# Patient Record
Sex: Male | Born: 1950 | Race: Black or African American | Hispanic: No | Smoking: Never smoker
Health system: Southern US, Community
[De-identification: ages and names within clinical notes are randomized; demographics above are authoritative.]

## PROBLEM LIST (undated history)

## (undated) DIAGNOSIS — H409 Unspecified glaucoma: Secondary | ICD-10-CM

## (undated) DIAGNOSIS — F259 Schizoaffective disorder, unspecified: Secondary | ICD-10-CM

## (undated) DIAGNOSIS — B182 Chronic viral hepatitis C: Secondary | ICD-10-CM

## (undated) DIAGNOSIS — G8929 Other chronic pain: Secondary | ICD-10-CM

## (undated) DIAGNOSIS — M549 Dorsalgia, unspecified: Secondary | ICD-10-CM

## (undated) DIAGNOSIS — F3130 Bipolar disorder, current episode depressed, mild or moderate severity, unspecified: Secondary | ICD-10-CM

## (undated) DIAGNOSIS — I10 Essential (primary) hypertension: Secondary | ICD-10-CM

## (undated) HISTORY — PX: HX NO SURGICAL PROCEDURES: 2100001501

---
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
## 2014-11-24 ENCOUNTER — Encounter (HOSPITAL_COMMUNITY): Payer: Self-pay | Admitting: Emergency Medicine

## 2014-11-24 ENCOUNTER — Emergency Department (INDEPENDENT_AMBULATORY_CARE_PROVIDER_SITE_OTHER)
Admission: EM | Admit: 2014-11-24 | Discharge: 2014-11-24 | Disposition: A | Discharge status: Home / Self Care | Payer: Medicare Other | Source: Home / Self Care | Attending: Family Medicine | Admitting: Family Medicine

## 2014-11-24 DIAGNOSIS — I10 Essential (primary) hypertension: Secondary | ICD-10-CM

## 2014-11-24 MED ORDER — AMLODIPINE BESYLATE 10 MG PO TABS: 10.0000 mg | ORAL_TABLET | Freq: Every day | ORAL | Status: DC

## 2014-11-24 NOTE — ED Provider Notes (Signed)
CSN: [id number]     Arrival date & time 11/24/14  1626 History   First MD Initiated Contact with Patient 11/24/14 1707     No chief complaint on file.  (Consider location/radiation/quality/duration/timing/severity/associated sxs/prior Treatment) HPI Comments: Patient presents today for a refill on Norvasc. He reports that his MD in [place] prescribed this, but he has no further refills and has not taken in a month. He generally gets local pharmacy. He reports no headaches, dizziness, chest pain, dyspnea. No edema. He has an appt with the [facility] on June 23rd.   The history is provided by the patient.    No past medical history on file. No past surgical history on file. No family history on file. History  Substance Use Topics  . Smoking status: Not on file  . Smokeless tobacco: Not on file  . Alcohol Use: Not on file    Review of Systems  All other systems reviewed and are negative.   Allergies  Review of patient's allergies indicates not on file.  Home Medications   Prior to Admission medications   Medication Sig Start Date End Date Taking? Authorizing Provider  amLODipine (NORVASC) 10 MG tablet Take 1 tablet (10 mg total) by mouth daily. 11/24/14   Riki Sheer, PA-C   BP 177/110 mmHg  Pulse 87  Temp(Src) 99.2 F (37.3 C) (Oral)  Resp 20  SpO2 100% Physical Exam  Constitutional: He is oriented to person, place, and time. He appears well-developed and well-nourished. No distress.  HENT:  Head: Normocephalic and atraumatic.  Cardiovascular: Normal rate and regular rhythm.   No murmur heard. Pulmonary/Chest: Effort normal and breath sounds normal. No respiratory distress.  Neurological: He is alert and oriented to person, place, and time.  Skin: Skin is warm and dry. He is not diaphoretic.  Psychiatric: His behavior is normal.  Nursing note and vitals reviewed.   ED Course  Procedures (including critical care time) Labs Review Labs Reviewed - No data  to display  Imaging Review No results found.   MDM   1. Essential hypertension   I called [facility] on [address] to verify he as getting the Norvasc and he was. He is asymptomatic. Ok to refill this time only. Will need to keep his f/u with PCP to establish care. Should he begin to have alarming symptoms, please go the ER    Riki Sheer, PA-C 11/24/14 1753

## 2014-11-24 NOTE — ED Notes (Signed)
Pt needs a refill on blood pressure medication

## 2014-11-24 NOTE — Discharge Instructions (Signed)
Hypertension °Hypertension, commonly called high blood pressure, is when the force of blood pumping through your arteries is too strong. Your arteries are the blood vessels that carry blood from your heart throughout your body. A blood pressure reading consists of a higher number over a lower number, such as 110/72. The higher number (systolic) is the pressure inside your arteries when your heart pumps. The lower number (diastolic) is the pressure inside your arteries when your heart relaxes. Ideally you want your blood pressure below 120/80. °Hypertension forces your heart to work harder to pump blood. Your arteries may become narrow or stiff. Having hypertension puts you at risk for heart disease, stroke, and other problems.  °RISK FACTORS °Some risk factors for high blood pressure are controllable. Others are not.  °Risk factors you cannot control include:  °· Race. You may be at higher risk if you are African American. °· Age. Risk increases with age. °· Gender. Men are at higher risk than women before age 45 years. After age 65, women are at higher risk than men. °Risk factors you can control include: °· Not getting enough exercise or physical activity. °· Being overweight. °· Getting too much fat, sugar, calories, or salt in your diet. °· Drinking too much alcohol. °SIGNS AND SYMPTOMS °Hypertension does not usually cause signs or symptoms. Extremely high blood pressure (hypertensive crisis) may cause headache, anxiety, shortness of breath, and nosebleed. °DIAGNOSIS  °To check if you have hypertension, your health care provider will measure your blood pressure while you are seated, with your arm held at the level of your heart. It should be measured at least twice using the same arm. Certain conditions can cause a difference in blood pressure between your right and left arms. A blood pressure reading that is higher than normal on one occasion does not mean that you need treatment. If one blood pressure reading  is high, ask your health care provider about having it checked again. °TREATMENT  °Treating high blood pressure includes making lifestyle changes and possibly taking medicine. Living a healthy lifestyle can help lower high blood pressure. You may need to change some of your habits. °Lifestyle changes may include: °· Following the DASH diet. This diet is high in fruits, vegetables, and whole grains. It is low in salt, red meat, and added sugars. °· Getting at least 2½ hours of brisk physical activity every week. °· Losing weight if necessary. °· Not smoking. °· Limiting alcoholic beverages. °· Learning ways to reduce stress. ° If lifestyle changes are not enough to get your blood pressure under control, your health care provider may prescribe medicine. You may need to take more than one. Work closely with your health care provider to understand the risks and benefits. °HOME CARE INSTRUCTIONS °· Have your blood pressure rechecked as directed by your health care provider.   °· Take medicines only as directed by your health care provider. Follow the directions carefully. Blood pressure medicines must be taken as prescribed. The medicine does not work as well when you skip doses. Skipping doses also puts you at risk for problems.   °· Do not smoke.   °· Monitor your blood pressure at home as directed by your health care provider.  °SEEK MEDICAL CARE IF:  °· You think you are having a reaction to medicines taken. °· You have recurrent headaches or feel dizzy. °· You have swelling in your ankles. °· You have trouble with your vision. °SEEK IMMEDIATE MEDICAL CARE IF: °· You develop a severe headache or confusion. °·   You have unusual weakness, numbness, or feel faint.  You have severe chest or abdominal pain.  You vomit repeatedly.  You have trouble breathing. MAKE SURE YOU:   Understand these instructions.  Will watch your condition.  Will get help right away if you are not doing well or get worse. Document  Released: 05/27/2005 Document Revised: 10/11/2013 Document Reviewed: 03/19/2013 [facility] Patient Information 2015 [place], [place]. This information is not intended to replace advice given to you by your health care provider. Make sure you discuss any questions you have with your health care provider.    Ok to resume your Norvasc; however if any side effects or you begin having unusual symptoms; please go the ED for further evaluation.

## 2015-01-11 ENCOUNTER — Encounter (HOSPITAL_COMMUNITY): Payer: Self-pay | Admitting: Emergency Medicine

## 2015-01-11 ENCOUNTER — Emergency Department (HOSPITAL_COMMUNITY)
Admission: EM | Admit: 2015-01-11 | Discharge: 2015-01-11 | Disposition: A | Discharge status: Home / Self Care | Payer: Medicare Other | Attending: Emergency Medicine | Admitting: Emergency Medicine

## 2015-01-11 ENCOUNTER — Inpatient Hospital Stay (HOSPITAL_COMMUNITY)
Admission: AD | Admit: 2015-01-11 | Discharge: 2015-01-18 | DRG: 885 | Disposition: A | Discharge status: Home / Self Care | Payer: Medicare Other | Source: Intra-hospital | Attending: Psychiatry | Admitting: Psychiatry

## 2015-01-11 ENCOUNTER — Encounter (HOSPITAL_COMMUNITY): Payer: Self-pay | Admitting: Cardiology

## 2015-01-11 ENCOUNTER — Emergency Department (HOSPITAL_COMMUNITY): Payer: Medicare Other

## 2015-01-11 ENCOUNTER — Encounter (HOSPITAL_COMMUNITY): Payer: Self-pay

## 2015-01-11 DIAGNOSIS — B182 Chronic viral hepatitis C: Secondary | ICD-10-CM | POA: Insufficient documentation

## 2015-01-11 DIAGNOSIS — I10 Essential (primary) hypertension: Secondary | ICD-10-CM | POA: Diagnosis present

## 2015-01-11 DIAGNOSIS — Z8669 Personal history of other diseases of the nervous system and sense organs: Secondary | ICD-10-CM | POA: Insufficient documentation

## 2015-01-11 DIAGNOSIS — F10230 Alcohol dependence with withdrawal, uncomplicated: Secondary | ICD-10-CM | POA: Diagnosis not present

## 2015-01-11 DIAGNOSIS — F1420 Cocaine dependence, uncomplicated: Secondary | ICD-10-CM | POA: Diagnosis present

## 2015-01-11 DIAGNOSIS — F1020 Alcohol dependence, uncomplicated: Secondary | ICD-10-CM | POA: Diagnosis present

## 2015-01-11 DIAGNOSIS — R45851 Suicidal ideations: Secondary | ICD-10-CM | POA: Diagnosis present

## 2015-01-11 DIAGNOSIS — Z880 Allergy status to penicillin: Secondary | ICD-10-CM | POA: Diagnosis not present

## 2015-01-11 DIAGNOSIS — Z79899 Other long term (current) drug therapy: Secondary | ICD-10-CM | POA: Diagnosis not present

## 2015-01-11 DIAGNOSIS — F1410 Cocaine abuse, uncomplicated: Secondary | ICD-10-CM | POA: Diagnosis not present

## 2015-01-11 DIAGNOSIS — F320 Major depressive disorder, single episode, mild: Secondary | ICD-10-CM | POA: Diagnosis not present

## 2015-01-11 DIAGNOSIS — R4689 Other symptoms and signs involving appearance and behavior: Secondary | ICD-10-CM

## 2015-01-11 DIAGNOSIS — Z8619 Personal history of other infectious and parasitic diseases: Secondary | ICD-10-CM | POA: Diagnosis not present

## 2015-01-11 DIAGNOSIS — F1910 Other psychoactive substance abuse, uncomplicated: Secondary | ICD-10-CM | POA: Diagnosis not present

## 2015-01-11 DIAGNOSIS — F332 Major depressive disorder, recurrent severe without psychotic features: Principal | ICD-10-CM | POA: Diagnosis present

## 2015-01-11 DIAGNOSIS — F1994 Other psychoactive substance use, unspecified with psychoactive substance-induced mood disorder: Secondary | ICD-10-CM | POA: Diagnosis present

## 2015-01-11 DIAGNOSIS — F329 Major depressive disorder, single episode, unspecified: Secondary | ICD-10-CM | POA: Insufficient documentation

## 2015-01-11 DIAGNOSIS — Z915 Personal history of self-harm: Secondary | ICD-10-CM

## 2015-01-11 DIAGNOSIS — R079 Chest pain, unspecified: Secondary | ICD-10-CM | POA: Diagnosis present

## 2015-01-11 DIAGNOSIS — R4589 Other symptoms and signs involving emotional state: Secondary | ICD-10-CM

## 2015-01-11 HISTORY — DX: Unspecified glaucoma: H40.9

## 2015-01-11 HISTORY — DX: Essential (primary) hypertension: I10

## 2015-01-11 HISTORY — DX: Chronic viral hepatitis C: B18.2

## 2015-01-11 LAB — CBC WITH DIFFERENTIAL/PLATELET
Basophils Absolute: 0 10*3/uL (ref 0.0–0.1)
Basophils Relative: 0 % (ref 0–1)
EOS ABS: 0 10*3/uL (ref 0.0–0.7)
Eosinophils Relative: 0 % (ref 0–5)
HCT: 39.3 % (ref 39.0–52.0)
Hemoglobin: 13.4 g/dL (ref 13.0–17.0)
LYMPHS PCT: 16 % (ref 12–46)
Lymphs Abs: 1.4 10*3/uL (ref 0.7–4.0)
MCH: 32.6 pg (ref 26.0–34.0)
MCHC: 34.1 g/dL (ref 30.0–36.0)
MCV: 95.6 fL (ref 78.0–100.0)
MONO ABS: 0.7 10*3/uL (ref 0.1–1.0)
Monocytes Relative: 8 % (ref 3–12)
Neutro Abs: 6.7 10*3/uL (ref 1.7–7.7)
Neutrophils Relative %: 76 % (ref 43–77)
PLATELETS: 196 10*3/uL (ref 150–400)
RBC: 4.11 MIL/uL — ABNORMAL LOW (ref 4.22–5.81)
RDW: 13.5 % (ref 11.5–15.5)
WBC: 8.8 10*3/uL (ref 4.0–10.5)

## 2015-01-11 LAB — BASIC METABOLIC PANEL
Anion gap: 12 (ref 5–15)
BUN: 14 mg/dL (ref 6–20)
CHLORIDE: 104 mmol/L (ref 101–111)
CO2: 20 mmol/L — AB (ref 22–32)
CREATININE: 1.41 mg/dL — AB (ref 0.61–1.24)
Calcium: 9.3 mg/dL (ref 8.9–10.3)
GFR calc Af Amer: 58 mL/min — ABNORMAL LOW (ref >60)
GFR calc non Af Amer: 50 mL/min — ABNORMAL LOW (ref >60)
GLUCOSE: 88 mg/dL (ref 65–99)
Potassium: 3.6 mmol/L (ref 3.5–5.1)
Sodium: 136 mmol/L (ref 135–145)

## 2015-01-11 LAB — COMPREHENSIVE METABOLIC PANEL
ALT: 34 U/L (ref 17–63)
ANION GAP: 10 (ref 5–15)
AST: 42 U/L — AB (ref 15–41)
Albumin: 4.2 g/dL (ref 3.5–5.0)
Alkaline Phosphatase: 63 U/L (ref 38–126)
BUN: 11 mg/dL (ref 6–20)
CO2: 25 mmol/L (ref 22–32)
CREATININE: 0.99 mg/dL (ref 0.61–1.24)
Calcium: 9.8 mg/dL (ref 8.9–10.3)
Chloride: 103 mmol/L (ref 101–111)
GFR calc non Af Amer: >60 mL/min (ref >60)
GLUCOSE: 79 mg/dL (ref 65–99)
POTASSIUM: 3.5 mmol/L (ref 3.5–5.1)
Sodium: 138 mmol/L (ref 135–145)
Total Bilirubin: 1.7 mg/dL — ABNORMAL HIGH (ref 0.3–1.2)
Total Protein: 8.5 g/dL — ABNORMAL HIGH (ref 6.5–8.1)

## 2015-01-11 LAB — ACETAMINOPHEN LEVEL

## 2015-01-11 LAB — RAPID URINE DRUG SCREEN, HOSP PERFORMED
AMPHETAMINES: NOT DETECTED
AMPHETAMINES: NOT DETECTED
BENZODIAZEPINES: NOT DETECTED
Barbiturates: NOT DETECTED
Barbiturates: NOT DETECTED
Benzodiazepines: NOT DETECTED
Cocaine: POSITIVE — AB
Cocaine: POSITIVE — AB
OPIATES: NOT DETECTED
Opiates: NOT DETECTED
Tetrahydrocannabinol: NOT DETECTED
Tetrahydrocannabinol: NOT DETECTED

## 2015-01-11 LAB — I-STAT TROPONIN, ED
TROPONIN I, POC: 0.02 ng/mL (ref 0.00–0.08)
Troponin i, poc: 0.02 ng/mL (ref 0.00–0.08)

## 2015-01-11 LAB — CBC
HCT: 43.1 % (ref 39.0–52.0)
HEMOGLOBIN: 14.6 g/dL (ref 13.0–17.0)
MCH: 32.6 pg (ref 26.0–34.0)
MCHC: 33.9 g/dL (ref 30.0–36.0)
MCV: 96.2 fL (ref 78.0–100.0)
Platelets: 207 10*3/uL (ref 150–400)
RBC: 4.48 MIL/uL (ref 4.22–5.81)
RDW: 13.6 % (ref 11.5–15.5)
WBC: 10.2 10*3/uL (ref 4.0–10.5)

## 2015-01-11 LAB — ETHANOL
Alcohol, Ethyl (B): 8 mg/dL — ABNORMAL HIGH (ref <5)
Alcohol, Ethyl (B): <5 mg/dL (ref <5)

## 2015-01-11 LAB — SALICYLATE LEVEL: Salicylate Lvl: <4 mg/dL (ref 2.8–30.0)

## 2015-01-11 MED ORDER — SODIUM CHLORIDE 0.9 % IV BOLUS (SEPSIS)
1000.0000 mL | Freq: Once | INTRAVENOUS | Status: AC
  Administered 2015-01-11: 1000 mL via INTRAVENOUS

## 2015-01-11 MED ORDER — LOPERAMIDE HCL 2 MG PO CAPS: 2.0000 mg | ORAL_CAPSULE | ORAL | Status: AC | PRN

## 2015-01-11 MED ORDER — VITAMIN B-1 100 MG PO TABS
100.0000 mg | ORAL_TABLET | Freq: Every day | ORAL | Status: DC
  Administered 2015-01-12 – 2015-01-18 (×7): 100 mg via ORAL
  Filled 2015-01-11 (×9): qty 1

## 2015-01-11 MED ORDER — ALUM & MAG HYDROXIDE-SIMETH 200-200-20 MG/5ML PO SUSP: 30.0000 mL | ORAL | Status: DC | PRN

## 2015-01-11 MED ORDER — AMLODIPINE BESYLATE 5 MG PO TABS: 10.0000 mg | ORAL_TABLET | Freq: Once | ORAL | Status: DC

## 2015-01-11 MED ORDER — IBUPROFEN 400 MG PO TABS: 600.0000 mg | ORAL_TABLET | Freq: Three times a day (TID) | ORAL | Status: DC | PRN

## 2015-01-11 MED ORDER — NITROGLYCERIN 2 % TD OINT
1.0000 [in_us] | TOPICAL_OINTMENT | Freq: Once | TRANSDERMAL | Status: AC
  Administered 2015-01-11: 1 [in_us] via TOPICAL
  Filled 2015-01-11: qty 1

## 2015-01-11 MED ORDER — LORAZEPAM 2 MG/ML IJ SOLN: 0.0000 mg | Freq: Four times a day (QID) | INTRAMUSCULAR | Status: DC

## 2015-01-11 MED ORDER — CHLORDIAZEPOXIDE HCL 25 MG PO CAPS: 25.0000 mg | ORAL_CAPSULE | Freq: Four times a day (QID) | ORAL | Status: AC | PRN

## 2015-01-11 MED ORDER — MAGNESIUM HYDROXIDE 400 MG/5ML PO SUSP: 30.0000 mL | Freq: Every day | ORAL | Status: DC | PRN

## 2015-01-11 MED ORDER — NICOTINE 21 MG/24HR TD PT24: 21.0000 mg | MEDICATED_PATCH | Freq: Every day | TRANSDERMAL | Status: DC

## 2015-01-11 MED ORDER — LORAZEPAM 1 MG PO TABS: 1.0000 mg | ORAL_TABLET | Freq: Three times a day (TID) | ORAL | Status: DC | PRN

## 2015-01-11 MED ORDER — ZOLPIDEM TARTRATE 5 MG PO TABS: 5.0000 mg | ORAL_TABLET | Freq: Every evening | ORAL | Status: DC | PRN

## 2015-01-11 MED ORDER — ONDANSETRON HCL 4 MG PO TABS: 4.0000 mg | ORAL_TABLET | Freq: Three times a day (TID) | ORAL | Status: DC | PRN

## 2015-01-11 MED ORDER — ACETAMINOPHEN 325 MG PO TABS: 650.0000 mg | ORAL_TABLET | ORAL | Status: DC | PRN

## 2015-01-11 MED ORDER — VITAMIN B-1 100 MG PO TABS
100.0000 mg | ORAL_TABLET | Freq: Every day | ORAL | Status: DC
  Administered 2015-01-11: 100 mg via ORAL
  Filled 2015-01-11: qty 1

## 2015-01-11 MED ORDER — HYDROXYZINE HCL 25 MG PO TABS: 25.0000 mg | ORAL_TABLET | Freq: Four times a day (QID) | ORAL | Status: AC | PRN

## 2015-01-11 MED ORDER — LORAZEPAM 1 MG PO TABS: 0.0000 mg | ORAL_TABLET | Freq: Four times a day (QID) | ORAL | Status: DC

## 2015-01-11 MED ORDER — ACETAMINOPHEN 325 MG PO TABS
650.0000 mg | ORAL_TABLET | Freq: Four times a day (QID) | ORAL | Status: DC | PRN
  Administered 2015-01-16 – 2015-01-18 (×3): 650 mg via ORAL
  Filled 2015-01-11 (×3): qty 2

## 2015-01-11 MED ORDER — ADULT MULTIVITAMIN W/MINERALS CH
1.0000 | ORAL_TABLET | Freq: Every day | ORAL | Status: DC
  Administered 2015-01-12 – 2015-01-18 (×7): 1 via ORAL
  Filled 2015-01-11 (×9): qty 1

## 2015-01-11 MED ORDER — AMLODIPINE BESYLATE 5 MG PO TABS
10.0000 mg | ORAL_TABLET | Freq: Every day | ORAL | Status: DC
  Administered 2015-01-11: 10 mg via ORAL
  Filled 2015-01-11: qty 2

## 2015-01-11 MED ORDER — LORAZEPAM 1 MG PO TABS: 0.0000 mg | ORAL_TABLET | Freq: Two times a day (BID) | ORAL | Status: DC

## 2015-01-11 MED ORDER — LORAZEPAM 2 MG/ML IJ SOLN
0.0000 mg | Freq: Two times a day (BID) | INTRAMUSCULAR | Status: DC
Start: 2015-01-13 — End: 2015-01-11

## 2015-01-11 MED ORDER — AMLODIPINE BESYLATE 10 MG PO TABS
10.0000 mg | ORAL_TABLET | Freq: Every day | ORAL | Status: DC
  Administered 2015-01-12 – 2015-01-18 (×7): 10 mg via ORAL
  Filled 2015-01-11 (×10): qty 1

## 2015-01-11 MED ORDER — TRAZODONE HCL 50 MG PO TABS
50.0000 mg | ORAL_TABLET | Freq: Every evening | ORAL | Status: DC | PRN
  Filled 2015-01-11: qty 1

## 2015-01-11 MED ORDER — THIAMINE HCL 100 MG/ML IJ SOLN: 100.0000 mg | Freq: Every day | INTRAMUSCULAR | Status: DC

## 2015-01-11 MED ORDER — ONDANSETRON 4 MG PO TBDP: 4.0000 mg | ORAL_TABLET | Freq: Four times a day (QID) | ORAL | Status: AC | PRN

## 2015-01-11 MED ORDER — ACETAMINOPHEN 325 MG PO TABS
650.0000 mg | ORAL_TABLET | Freq: Once | ORAL | Status: AC
  Administered 2015-01-11: 650 mg via ORAL
  Filled 2015-01-11: qty 2

## 2015-01-11 NOTE — BH Assessment (Addendum)
Tele Assessment Note   Tyler Farrell is an 64 y.o. male who voluntarily presents to [facility] for depression and SI w/ a plan to "walk in front of a car". Pt reports that he doesn't have any medication and was last hospitalized in March, 2016 in [place], [place]. Pt admits that once he was released from the hospital, he did not follow up w/ OP referrals. He indicates that he "need to get on medication and stay on it".  Patient is currently living in a shelter, but indicates that he has income from [place] and [place] and children that he can live with  [facility] and/or [place]).   Pt indicates that he has a hx of alcohol and cocaine abuse and last used both on yesterday morning. In regards to various withdrawal symptoms, he answered "sometimes" or "a couple of times" to almost all of them however; he did not appear to be having any type of current withdrawal symptoms. SA BAL was <5 and UDS tested positive for cocaine.   Pt disclosed having 2 previous SA of standing on the railing of a bridge. Pt also disclosed having AH of a voice calling his name.    Pt was oriented x 4. His speech was logical and coherent. His thought process was normal and focused.   Axis I: Major Depression, Recurrent severe Axis II: Deferred Axis III:  Past Medical History  Diagnosis Date  . Hypertension   . Hep C w/o coma, chronic   . Glaucoma    Axis IV: housing problems, other psychosocial or environmental problems and problems related to social environment Axis V: 41-50 serious symptoms  Past Medical History:  Past Medical History  Diagnosis Date  . Hypertension   . Hep C w/o coma, chronic   . Glaucoma     History reviewed. No pertinent past surgical history.  Family History: History reviewed. No pertinent family history.  Social History:  reports that he has never smoked. He does not have any smokeless tobacco history on file. He reports that he drinks alcohol. He reports that he uses illicit drugs (Marijuana and  Cocaine).  Additional Social History:  Alcohol / Drug Use Pain Medications: SEE MAR Prescriptions: SEE MAR Over the Counter: SEE MAR History of alcohol / drug use?: Yes Longest period of sobriety (when/how long): unable to specify Negative Consequences of Use:  (did not specify) Withdrawal Symptoms: Agitation, Aggressive/Assaultive, Blackouts, Cramps, Delirium, Diarrhea, Sweats, Nausea / Vomiting, Irritability, Weakness, Tremors, Fever / Chills, Tingling Substance #1 Name of Substance 1: Alcohol 1 - Age of First Use: 16 yrs 1 - Amount (size/oz): 6-7 16 oz cans of beer 1 - Frequency: daily 1 - Duration: ongoing 1 - Last Use / Amount: yesterday morning Substance #2 Name of Substance 2: Cocaine 2 - Age of First Use: 22 yrs 2 - Amount (size/oz): 1 gram 2 - Frequency: monthly 2 - Duration: ongoing 2 - Last Use / Amount: yesterday morning  CIWA: CIWA-Ar BP: 156/97 mmHg Pulse Rate: 97 Nausea and Vomiting: no nausea and no vomiting Tactile Disturbances: none Tremor: no tremor Auditory Disturbances: very mild harshness or ability to frighten Paroxysmal Sweats: no sweat visible Visual Disturbances: not present Anxiety: no anxiety, at ease Headache, Fullness in Head: very mild Agitation: somewhat more than normal activity Orientation and Clouding of Sensorium: oriented and can do serial additions CIWA-Ar Total: 3 COWS:    PATIENT STRENGTHS: (choose at least two) Ability for insight Average or above average intelligence Capable of independent living Communication skills  Financial means Supportive family/friends  Allergies:  Allergies  Allergen Reactions  . Penicillins Other (See Comments)    unknown    Home Medications:  (Not in a hospital admission)  OB/GYN Status:  No LMP for male patient.  General Assessment Data Location of Assessment: [facility] ED TTS Assessment: In system Is this a Tele or Face-to-Face Assessment?: Tele Assessment Is this an Initial Assessment or a  Re-assessment for this encounter?: Initial Assessment Marital status: Divorced Century name: n/a Is patient pregnant?: No Pregnancy Status: No Living Arrangements: Other (Comment) (Shelter) Can pt return to current living arrangement?: Yes Admission Status: Voluntary Is patient capable of signing voluntary admission?: Yes Referral Source: Self/Family/Friend Insurance type: Medicaid/Medicare  Medical Screening Exam [facility] Walk-in ONLY) Medical Exam completed: Yes  Crisis Care Plan Living Arrangements: Other (Comment) (Shelter) Name of Psychiatrist: n/a Name of Therapist: n/a  Education Status Is patient currently in school?: No Current Grade: n/a Highest grade of school patient has completed: unknown Name of school: n/a Contact person: n/a  Risk to self with the past 6 months Suicidal Ideation: Yes-Currently Present Has patient been a risk to self within the past 6 months prior to admission? : Yes Suicidal Intent: Yes-Currently Present Has patient had any suicidal intent within the past 6 months prior to admission? : Yes Is patient at risk for suicide?: Yes Suicidal Plan?: Yes-Currently Present Has patient had any suicidal plan within the past 6 months prior to admission? : Yes Specify Current Suicidal Plan: walk in front of a car Access to Means: Yes Specify Access to Suicidal Means: pt is ambulatory and can walk in front of a car What has been your use of drugs/alcohol within the last 12 months?: chronic Previous Attempts/Gestures: Yes How many times?: 2 Other Self Harm Risks: n Triggers for Past Attempts:  (Pt's perpetual transient lifestyle) Intentional Self Injurious Behavior: None Family Suicide History: No Recent stressful life event(s):  (None) Persecutory voices/beliefs?: No Depression: Yes Depression Symptoms: Despondent, Feeling angry/irritable Substance abuse history and/or treatment for substance abuse?: Yes Suicide prevention information given to  non-admitted patients: Not applicable  Risk to Others within the past 6 months Homicidal Ideation: No Does patient have any lifetime risk of violence toward others beyond the six months prior to admission? : No Thoughts of Harm to Others: No Current Homicidal Intent: No Current Homicidal Plan: No Access to Homicidal Means: No Identified Victim: n/a History of harm to others?: No Assessment of Violence: None Noted Violent Behavior Description: n/a Does patient have access to weapons?: No Criminal Charges Pending?: No Does patient have a court date: No Is patient on probation?: No  Psychosis Hallucinations: Auditory Delusions: None noted  Mental Status Report Appearance/Hygiene: Unremarkable Eye Contact: Good Motor Activity: Unremarkable Speech: Logical/coherent Level of Consciousness: Drowsy Mood: Depressed Affect: Appropriate to circumstance Anxiety Level: Minimal Thought Processes: Coherent, Relevant Judgement: Impaired Orientation: Person, Place, Time, Situation Obsessive Compulsive Thoughts/Behaviors: None  Cognitive Functioning Concentration: Good Memory: Recent Intact, Remote Intact IQ: Average Insight: Good Impulse Control: Fair Appetite: Poor Weight Loss: 0 Weight Gain: 0 Sleep: Decreased Total Hours of Sleep: 6 Vegetative Symptoms: None  ADLScreening [facility] Assessment Services) Patient's cognitive ability adequate to safely complete daily activities?: Yes Patient able to express need for assistance with ADLs?: Yes Independently performs ADLs?: Yes (appropriate for developmental age)  Prior Inpatient Therapy Prior Inpatient Therapy: Yes Prior Therapy Dates: 2016; 2011; 2010 Prior Therapy Facilty/Provider(s): [facility] in [place], [place]; [facility] in [place], [place]; [facility] in [place], [place] Reason  for Treatment: Depression; SI; Bipolar; Schizophrenia  Prior Outpatient Therapy Prior Outpatient Therapy: No (pt admits to never following up w/ OP  referrals) Prior Therapy Dates: n/a Prior Therapy Facilty/Provider(s): n/a Reason for Treatment: n/a Does patient have an ACCT team?: No Does patient have Intensive In-House Services?  : No Does patient have [facility] services? : No Does patient have P4CC services?: No  ADL Screening (condition at time of admission) Patient's cognitive ability adequate to safely complete daily activities?: Yes Is the patient deaf or have difficulty hearing?: No Does the patient have difficulty seeing, even when wearing glasses/contacts?: No Does the patient have difficulty concentrating, remembering, or making decisions?: No Patient able to express need for assistance with ADLs?: Yes Does the patient have difficulty dressing or bathing?: No Independently performs ADLs?: Yes (appropriate for developmental age) Does the patient have difficulty walking or climbing stairs?: No       Abuse/Neglect Assessment (Assessment to be complete while patient is alone) Physical Abuse: Denies Verbal Abuse: Denies Sexual Abuse: Denies Exploitation of patient/patient's resources: Denies Self-Neglect: Denies Values / Beliefs Cultural Requests During Hospitalization: None Spiritual Requests During Hospitalization: None Consults Spiritual Care Consult Needed: No Social Work Consult Needed: No Merchant navy officer (For Healthcare) Does patient have an advance directive?: No Would patient like information on creating an advanced directive?: No - patient declined information    Additional Information 1:1 In Past 12 Months?: No CIRT Risk: No Elopement Risk: No Does patient have medical clearance?: Yes     Disposition:  Per [place], NP and Dr. Dub Mikes, pt meets IP criteria and has been accepted to [facility], 403-1. Accepting doctor is [name].  Laddie Aquas 01/11/2015 4:17 PM

## 2015-01-11 NOTE — ED Notes (Signed)
Pt reports that he has been depressed and having SI for the past week. Reports he does not have his medication right yet. Recently in the hospital in [place] for the same.

## 2015-01-11 NOTE — ED Provider Notes (Addendum)
CSN: [id number]     Arrival date & time 01/11/15  1328 History   First MD Initiated Contact with Patient 01/11/15 1501     Chief Complaint  Patient presents with  . Suicidal  . Depression     (Consider location/radiation/quality/duration/timing/severity/associated sxs/prior Treatment) Patient is a 64 y.o. male presenting with mental health disorder. The history is provided by the patient.  Mental Health Problem Presenting symptoms: suicidal thoughts   Degree of incapacity (severity):  Moderate Onset quality:  Gradual Duration:  2 weeks Timing:  Constant Progression:  Unchanged Chronicity:  New Context: alcohol use, drug abuse and stressful life event (homeless, lives at [facility])   Worsened by:  Nothing tried Ineffective treatments:  None tried   Past Medical History  Diagnosis Date  . Hypertension   . Hep C w/o coma, chronic   . Glaucoma    History reviewed. No pertinent past surgical history. History reviewed. No pertinent family history. History  Substance Use Topics  . Smoking status: Never Smoker   . Smokeless tobacco: Not on file  . Alcohol Use: Yes     Comment: occ    Review of Systems  Constitutional: Negative for fever.  Respiratory: Negative for cough and shortness of breath.   Psychiatric/Behavioral: Positive for suicidal ideas.  All other systems reviewed and are negative.     Allergies  Penicillins  Home Medications   Prior to Admission medications   Medication Sig Start Date End Date Taking? Authorizing Provider  amLODipine (NORVASC) 10 MG tablet Take 1 tablet (10 mg total) by mouth daily. 11/24/14   Riki Sheer, PA-C   BP 156/97 mmHg  Pulse 97  Temp(Src) 98.5 F (36.9 C)  Resp 16  Ht 5\' 11"  (1.803 m)  Wt 180 lb (81.647 kg)  BMI 25.12 kg/m2  SpO2 95% Physical Exam  Constitutional: He is oriented to person, place, and time. He appears well-developed and well-nourished. No distress.  HENT:  Head: Normocephalic and atraumatic.   Mouth/Throat: No oropharyngeal exudate.  Eyes: EOM are normal. Pupils are equal, round, and reactive to light.  Neck: Normal range of motion. Neck supple.  Cardiovascular: Normal rate and regular rhythm.  Exam reveals no friction rub.   No murmur heard. Pulmonary/Chest: Effort normal and breath sounds normal. No respiratory distress. He has no wheezes. He has no rales.  Abdominal: He exhibits no distension. There is no tenderness. There is no rebound.  Musculoskeletal: Normal range of motion. He exhibits no edema.  Neurological: He is alert and oriented to person, place, and time.  Skin: He is not diaphoretic.  Nursing note and vitals reviewed.   ED Course  Procedures (including critical care time) Labs Review Labs Reviewed  COMPREHENSIVE METABOLIC PANEL - Abnormal; Notable for the following:    Total Protein 8.5 (*)    AST 42 (*)    Total Bilirubin 1.7 (*)    All other components within normal limits  ACETAMINOPHEN LEVEL - Abnormal; Notable for the following:    Acetaminophen (Tylenol), Serum <10 (*)    All other components within normal limits  ETHANOL  SALICYLATE LEVEL  CBC  URINE RAPID DRUG SCREEN, HOSP PERFORMED    Imaging Review Dg Chest 2 View  01/11/2015   CLINICAL DATA:  Chest pain and cough  EXAM: CHEST  2 VIEW  COMPARISON:  None.  FINDINGS: There is left hemidiaphragm elevation. There is minimal ground-glass opacity adjacent to the elevated hemidiaphragm, likely atelectatic. The right lung is clear. There is  no pleural effusion. Hilar and mediastinal contours are unremarkable.  IMPRESSION: Mild atelectatic appearing opacities adjacent to the elevated left hemidiaphragm.   Electronically Signed   By: Ellery Plunk M.D.   On: 01/11/2015 02:34     EKG Interpretation None      MDM   Final diagnoses:  Suicidal behavior    64 year old male presents with suicidal ideation. Has a plan to walk into traffic. He is homeless. He is well-appearing here, eating for  asystole. He does have history of suicidal ideation with plan to jump off a bridge. Will consult TTS. CIWA protocol initiated since he is a chronic drinker. He is here voluntarily.  [name], MD 01/11/15 1644  [name], MD 01/13/15 [phone]

## 2015-01-11 NOTE — Discharge Instructions (Signed)
Please follow up with the [facility] to schedule a follow up appointment. Please read all discharge instructions and return precautions.    Chest Pain (Nonspecific) It is often hard to give a specific diagnosis for the cause of chest pain. There is always a chance that your pain could be related to something serious, such as a heart attack or a blood clot in the lungs. You need to follow up with your health care provider for further evaluation. CAUSES   Heartburn.  Pneumonia or bronchitis.  Anxiety or stress.  Inflammation around your heart (pericarditis) or lung (pleuritis or pleurisy).  A blood clot in the lung.  A collapsed lung (pneumothorax). It can develop suddenly on its own (spontaneous pneumothorax) or from trauma to the chest.  Shingles infection (herpes zoster virus). The chest wall is composed of bones, muscles, and cartilage. Any of these can be the source of the pain.  The bones can be bruised by injury.  The muscles or cartilage can be strained by coughing or overwork.  The cartilage can be affected by inflammation and become sore (costochondritis). DIAGNOSIS  Lab tests or other studies may be needed to find the cause of your pain. Your health care provider may have you take a test called an ambulatory electrocardiogram (ECG). An ECG records your heartbeat patterns over a 24-hour period. You may also have other tests, such as:  Transthoracic echocardiogram (TTE). During echocardiography, sound waves are used to evaluate how blood flows through your heart.  Transesophageal echocardiogram (TEE).  Cardiac monitoring. This allows your health care provider to monitor your heart rate and rhythm in real time.  Holter monitor. This is a portable device that records your heartbeat and can help diagnose heart arrhythmias. It allows your health care provider to track your heart activity for several days, if needed.  Stress tests by exercise or by giving medicine that  makes the heart beat faster. TREATMENT   Treatment depends on what may be causing your chest pain. Treatment may include:  Acid blockers for heartburn.  Anti-inflammatory medicine.  Pain medicine for inflammatory conditions.  Antibiotics if an infection is present.  You may be advised to change lifestyle habits. This includes stopping smoking and avoiding alcohol, caffeine, and chocolate.  You may be advised to keep your head raised (elevated) when sleeping. This reduces the chance of acid going backward from your stomach into your esophagus. Most of the time, nonspecific chest pain will improve within 2-3 days with rest and mild pain medicine.  HOME CARE INSTRUCTIONS   If antibiotics were prescribed, take them as directed. Finish them even if you start to feel better.  For the next few days, avoid physical activities that bring on chest pain. Continue physical activities as directed.  Do not use any tobacco products, including cigarettes, chewing tobacco, or electronic cigarettes.  Avoid drinking alcohol.  Only take medicine as directed by your health care provider.  Follow your health care provider's suggestions for further testing if your chest pain does not go away.  Keep any follow-up appointments you made. If you do not go to an appointment, you could develop lasting (chronic) problems with pain. If there is any problem keeping an appointment, call to reschedule. SEEK MEDICAL CARE IF:   Your chest pain does not go away, even after treatment.  You have a rash with blisters on your chest.  You have a fever. SEEK IMMEDIATE MEDICAL CARE IF:   You have increased chest pain or pain  that spreads to your arm, neck, jaw, back, or abdomen.  You have shortness of breath.  You have an increasing cough, or you cough up blood.  You have severe back or abdominal pain.  You feel nauseous or vomit.  You have severe weakness.  You faint.  You have chills. This is an  emergency. Do not wait to see if the pain will go away. Get medical help at once. Call your local emergency services (911 in [place] Do not drive yourself to the hospital. MAKE SURE YOU:   Understand these instructions.  Will watch your condition.  Will get help right away if you are not doing well or get worse. Document Released: 03/06/2005 Document Revised: 06/01/2013 Document Reviewed: 12/31/2007 [facility] Patient Information 2015 [place], [place]. This information is not intended to replace advice given to you by your health care provider. Make sure you discuss any questions you have with your health care provider. Stimulant Use Disorder-Cocaine Cocaine is one of a group of powerful drugs called stimulants. Cocaine has medical uses for stopping nosebleeds and for pain control before minor nose or dental surgery. However, cocaine is misused because of the effects that it produces. These effects include:   A feeling of extreme pleasure.  Alertness.  High energy. Common street names for cocaine include coke, crack, blow, snow, and nose candy. Cocaine is snorted, dissolved in water and injected, or smoked.  Stimulants are addictive because they activate regions of the brain that produce both the pleasurable sensation of "reward" and psychological dependence. Together, these actions account for loss of control and the rapid development of drug dependence. This means you become ill without the drug (withdrawal) and need to keep using it to function.  Stimulant use disorder is use of stimulants that disrupts your daily life. It disrupts relationships with family and friends and how you do your job. Cocaine increases your blood pressure and heart rate. It can cause a heart attack or stroke. Cocaine can also cause death from irregular heart rate or seizures. SYMPTOMS Symptoms of stimulant use disorder with cocaine include:  Use of cocaine in larger amounts or over a longer period of time than  intended.  Unsuccessful attempts to cut down or control cocaine use.  A lot of time spent obtaining, using, or recovering from the effects of cocaine.  A strong desire or urge to use cocaine (craving).  Continued use of cocaine in spite of major problems at work, school, or home because of use.  Continued use of cocaine in spite of relationship problems because of use.  Giving up or cutting down on important life activities because of cocaine use.  Use of cocaine over and over in situations when it is physically hazardous, such as driving a car.  Continued use of cocaine in spite of a physical problem that is likely related to use. Physical problems can include:  Malnutrition.  Nosebleeds.  Chest pain.  High blood pressure.  A hole that develops between the part of your nose that separates your nostrils (perforated nasal septum).  Lung and kidney damage.  Continued use of cocaine in spite of a mental problem that is likely related to use. Mental problems can include:  Schizophrenia-like symptoms.  Depression.  Bipolar mood swings.  Anxiety.  Sleep problems.  Need to use more and more cocaine to get the same effect, or lessened effect over time with use of the same amount of cocaine (tolerance).  Having withdrawal symptoms when cocaine use is stopped, or  using cocaine to reduce or avoid withdrawal symptoms. Withdrawal symptoms include:  Depressed or irritable mood.  Low energy or restlessness.  Bad dreams.  Poor or excessive sleep.  Increased appetite. DIAGNOSIS Stimulant use disorder is diagnosed by your health care provider. You may be asked questions about your cocaine use and how it affects your life. A physical exam may be done. A drug screen may be ordered. You may be referred to a mental health professional. The diagnosis of stimulant use disorder requires at least two symptoms within 12 months. The type of stimulant use disorder depends on the number of  signs and symptoms you have. The type may be:  Mild. Two or three signs and symptoms.  Moderate. Four or five signs and symptoms.  Severe. Six or more signs and symptoms. TREATMENT Treatment for stimulant use disorder is usually provided by mental health professionals with training in substance use disorders. The following options are available:  Counseling or talk therapy. Talk therapy addresses the reasons you use cocaine and ways to keep you from using again. Goals of talk therapy include:  Identifying and avoiding triggers for use.  Handling cravings.  Replacing use with healthy activities.  Support groups. Support groups provide emotional support, advice, and guidance.  Medicine. Certain medicines may decrease cocaine cravings or withdrawal symptoms. HOME CARE INSTRUCTIONS  Take medicines only as directed by your health care provider.  Identify the people and activities that trigger your cocaine use and avoid them.  Keep all follow-up visits as directed by your health care provider. SEEK MEDICAL CARE IF:  Your symptoms get worse or you relapse.  You are not able to take medicines as directed. SEEK IMMEDIATE MEDICAL CARE IF:  You have serious thoughts about hurting yourself or others.  You have a seizure, chest pain, sudden weakness, or loss of speech or vision. FOR MORE INFORMATION  National Institute on Drug Abuse: http://www.price-smith.com/  Substance Abuse and Mental Health Services Administration: SkateOasis.com.pt Document Released: 05/24/2000 Document Revised: 10/11/2013 Document Reviewed: 06/09/2013 [facility] Patient Information 2015 [place], [place]. This information is not intended to replace advice given to you by your health care provider. Make sure you discuss any questions you have with your health care provider.

## 2015-01-11 NOTE — ED Notes (Signed)
Jen PA at bedside. 

## 2015-01-11 NOTE — ED Notes (Signed)
Pt getting dressed.

## 2015-01-11 NOTE — ED Notes (Addendum)
Per EMS - pt was walking outside, ETOH on board, called EMS c/o central CP with no radiation, no shortness of breath occurring around midnight.  Pain 8/10 initially, given 324 ASA and 3 x nitro SL, pain decreased to 6/10.  VS: 153/107 initially, after nitro: 126/86 RA sitting and 138/104 LA sitting.  ST on monitor 118-130.  Pt has hx HTN, but has been out of his meds for 4 days.  A&O x 4.  Pt admits to cocaine use right before CP occurred.

## 2015-01-11 NOTE — ED Notes (Addendum)
Pt states he was recently treated for depression and SI in [facility] and did not follow up on discharge paln.  He states he took a bus here but knows no one.  Homeless staying at [facility], states he has not eaten in 3 days, offered food here.  Pt states he drinks 7 quarts of beer per day and states he can not quit on his own.  Pt states occasionally smoke crack and Marijauna once per mth.

## 2015-01-11 NOTE — [facility] Counselor (Addendum)
[facility] Assessment Progress Note  Pt has been accepted to [facility], 403-1. Accepting doctor is [name].  Counselor advised EDP Dr. Gwendolyn Grant and pt's nurse, Wille Celeste of the acceptance. Pt can come after 7pm.    Tyler Farrell. [name], MS, NCC, LPCA Disposition Counselor

## 2015-01-11 NOTE — ED Notes (Signed)
Nitro paste removed by this [employer] fundraiser

## 2015-01-11 NOTE — ED Notes (Signed)
Ordered pt hot meal.

## 2015-01-11 NOTE — ED Provider Notes (Signed)
CSN: [id number]     Arrival date & time 01/11/15  0139 History   First MD Initiated Contact with Patient 01/11/15 0153     Chief Complaint  Patient presents with  . Chest Pain     (Consider location/radiation/quality/duration/timing/severity/associated sxs/prior Treatment) HPI Comments: Patient is a 64 yo male past medical history significant for hypertension presenting to the emergency department for evaluation of central chest pain without radiation. He states his pain began around midnight. Describes it as pressure. States his pain improved but did not resolve with aspirin and nitroglycerin given to him by EMS. Denies any associated shortness of breath, syncope, nausea, vomiting, diaphoresis. Patient endorses using cocaine and alcohol (3 quarts) today. No history of DVT/PE. No history of ETOH withdrawal. Patient states he was admitted for CP r/o in [place] and told he may have had an MI before. No stent placement.    Past Medical History  Diagnosis Date  . Hypertension   . Hep C w/o coma, chronic   . Glaucoma    History reviewed. No pertinent past surgical history. History reviewed. No pertinent family history. History  Substance Use Topics  . Smoking status: Never Smoker   . Smokeless tobacco: Not on file  . Alcohol Use: Yes     Comment: occ    Review of Systems  Constitutional: Negative for fever.  Respiratory: Negative for cough and shortness of breath.   Cardiovascular: Positive for chest pain. Negative for palpitations and leg swelling.  Neurological: Negative for syncope.  All other systems reviewed and are negative.     Allergies  Penicillins  Home Medications   Prior to Admission medications   Medication Sig Start Date End Date Taking? Authorizing Provider  amLODipine (NORVASC) 10 MG tablet Take 1 tablet (10 mg total) by mouth daily. 11/24/14  Yes Dillard Cannon Young, PA-C   BP 134/93 mmHg  Pulse 91  Temp(Src) 98.1 F (36.7 C) (Oral)  Resp 15  Ht   (1.803 m)  Wt 180 lb (81.647 kg)  BMI 25.12 kg/m2  SpO2 97% Physical Exam  Constitutional: He is oriented to person, place, and time. He appears well-developed and well-nourished. No distress.  HENT:  Head: Normocephalic and atraumatic.  Right Ear: External ear normal.  Left Ear: External ear normal.  Nose: Nose normal.  Eyes: Conjunctivae are normal.  Neck: Neck supple.  Cardiovascular: Normal rate, regular rhythm, normal heart sounds and intact distal pulses.   Pulmonary/Chest: Effort normal and breath sounds normal. No respiratory distress.  Abdominal: Soft. There is no tenderness.  Musculoskeletal: Normal range of motion. He exhibits no edema.  Neurological: He is alert and oriented to person, place, and time.  Skin: Skin is warm and dry. He is not diaphoretic.  Nursing note and vitals reviewed.   ED Course  Procedures (including critical care time) Medications  sodium chloride 0.9 % bolus 1,000 mL (0 mLs Intravenous Stopped 01/11/15 0334)  nitroGLYCERIN (NITROGLYN) 2 % ointment 1 inch (1 inch Topical Given 01/11/15 0243)  acetaminophen (TYLENOL) tablet 650 mg (650 mg Oral Given 01/11/15 0455)    Labs Review Labs Reviewed  URINE RAPID DRUG SCREEN, HOSP PERFORMED - Abnormal; Notable for the following:    Cocaine POSITIVE (*)    All other components within normal limits  BASIC METABOLIC PANEL - Abnormal; Notable for the following:    CO2 20 (*)    Creatinine, Ser 1.41 (*)    GFR calc non Af Amer 50 (*)    GFR calc  Af Amer 58 (*)    All other components within normal limits  CBC WITH DIFFERENTIAL/PLATELET - Abnormal; Notable for the following:    RBC 4.11 (*)    All other components within normal limits  ETHANOL - Abnormal; Notable for the following:    Alcohol, Ethyl (B) 8 (*)    All other components within normal limits  I-STAT TROPOININ, ED  Rosezena Sensor, ED    Imaging Review Dg Chest 2 View  01/11/2015   CLINICAL DATA:  Chest pain and cough  EXAM: CHEST  2 VIEW   COMPARISON:  None.  FINDINGS: There is left hemidiaphragm elevation. There is minimal ground-glass opacity adjacent to the elevated hemidiaphragm, likely atelectatic. The right lung is clear. There is no pleural effusion. Hilar and mediastinal contours are unremarkable.  IMPRESSION: Mild atelectatic appearing opacities adjacent to the elevated left hemidiaphragm.   Electronically Signed   By: Ellery Plunk M.D.   On: 01/11/2015 02:34     EKG Interpretation   Date/Time:  Wednesday January 11 2015 02:07:03 EDT Ventricular Rate:  87 PR Interval:  166 QRS Duration: 84 QT Interval:  376 QTC Calculation: 452 R Axis:   38 Text Interpretation:  Sinus rhythm Nonspecific T abnormalities, diffuse  leads No old tracing to compare Confirmed by OTTER  MD, OLGA ([id number]) on  01/11/2015 2:08:40 AM      MDM   Final diagnoses:  Chest pain, central  Cocaine abuse    Filed Vitals:   01/11/15 0549  BP: 134/93  Pulse: 91  Temp:   Resp: 15   Afebrile, NAD, non-toxic appearing, AAOx4.   I have reviewed nursing notes, vital signs, and all lab and all imaging results as noted above.  Patient presenting to the emergency department with chest pain after alcohol and cocaine use. On examination patient is comfortable appearing. Regular rate and rhythm appreciated without murmur. Lungs are auscultation bilaterally. No lower extremity edema. VSS, no tracheal deviation, no JVD or new murmur, RRR, breath sounds equal bilaterally, EKG reviewed no acute changes on repeat at time of delta troponin, negative delta troponin, and negative CXR. Patient is to be discharged with recommendation to follow up with PCP in regards to today's hospital visit. Chest pain is likely due to cocaine induced vasospasm. Return precautions discussed. Patient is stable at time of discharge  Patient d/w with Dr. Norlene Campbell, agrees with plan.      Francee Piccolo, PA-C 01/11/15 9562  Marisa Severin, MD 01/11/15 [phone]

## 2015-01-12 ENCOUNTER — Encounter (HOSPITAL_COMMUNITY): Payer: Self-pay | Admitting: Psychiatry

## 2015-01-12 DIAGNOSIS — R45851 Suicidal ideations: Secondary | ICD-10-CM

## 2015-01-12 DIAGNOSIS — F1420 Cocaine dependence, uncomplicated: Secondary | ICD-10-CM | POA: Diagnosis present

## 2015-01-12 DIAGNOSIS — F332 Major depressive disorder, recurrent severe without psychotic features: Secondary | ICD-10-CM | POA: Diagnosis present

## 2015-01-12 DIAGNOSIS — F1994 Other psychoactive substance use, unspecified with psychoactive substance-induced mood disorder: Secondary | ICD-10-CM | POA: Diagnosis present

## 2015-01-12 DIAGNOSIS — F1020 Alcohol dependence, uncomplicated: Secondary | ICD-10-CM | POA: Diagnosis present

## 2015-01-12 MED ORDER — ENSURE ENLIVE PO LIQD
237.0000 mL | ORAL | Status: DC
  Administered 2015-01-15: 237 mL via ORAL

## 2015-01-12 MED ORDER — DORZOLAMIDE HCL 2 % OP SOLN
1.0000 [drp] | Freq: Three times a day (TID) | OPHTHALMIC | Status: DC
  Administered 2015-01-12 – 2015-01-18 (×18): 1 [drp] via OPHTHALMIC
  Filled 2015-01-12: qty 10

## 2015-01-12 NOTE — Progress Notes (Signed)
Patient did not attend karaoke group tonight. 

## 2015-01-12 NOTE — Progress Notes (Signed)
D: Has been in bed since beginning of the shift. Awoke to name. A/Ox4. No acute distress noted. Brief/minimal with responses. States he had a good day and is just tired. He denied SI/HI/AVH and contracts for safety. Denied pain/discomfort. Refused Ensure stating he did not want it tonight. Offered no questions or concerns.  A: Support and encouragement provided. Safety has been maintained with q15 minute obs. Encouraged pt to let writer know if he changes his mind about supplement.  R: Pt remains safe. Will continue current POC and q15 minute obs for safety.

## 2015-01-12 NOTE — Tx Team (Signed)
Interdisciplinary Treatment Plan Update (Adult) Date: 01/12/2015   Date: 01/12/2015 4:11 PM  Progress in Treatment:  Attending groups: Yes  Participating in groups: Yes  Taking medication as prescribed: Yes  Tolerating medication: Yes  Family/Significant othe contact made: No, CSW assessing for appropriate contact Patient understands diagnosis: Yes AEB requesting referrals for residential substance abuse treatement Discussing patient identified problems/goals with staff: Yes  Medical problems stabilized or resolved: Yes  Denies suicidal/homicidal ideation: Yes Patient has not harmed self or Others: Yes   New problem(s) identified: None identified at this time.   Discharge Plan or Barriers: CSW will assess for appropriate discharge plan and relevant barriers.   Additional comments: n/a   Reason for Continuation of Hospitalization:  Depression Medication stabilization Suicidal ideation Withdrawal symptoms  Estimated length of stay: 3-5 days  Review of initial/current patient goals per problem list:   1.  Goal(s): Patient will participate in aftercare plan  Met:  Goal progressing  Target date: 3-5 days from date of admission   As evidenced by: Patient will participate within aftercare plan AEB aftercare provider and housing plan at discharge being identified.   01/12/15: Pt is requesting referrals to long-term substance abuse treatment.   2.  Goal (s): Patient will exhibit decreased depressive symptoms and suicidal ideations.  Met:  No  Target date: 3-5 days from date of admission   As evidenced by: Patient will utilize self rating of depression at 3 or below and demonstrate decreased signs of depression or be deemed stable for discharge by MD. 01/12/15: Pt was admitted with symptoms of depression, rating 10/10. Pt continues to present with flat affect and depressive symptoms.  Pt will demonstrate decreased symptoms of depression and rate depression at 3/10 or lower prior to  discharge.  4.  Goal(s): Patient will demonstrate decreased signs of withdrawal due to substance abuse  Met:  Goal progressing  Target date:  As evidenced by: Patient will produce a CIWA/COWS score of 0, have stable vitals signs, and no symptoms of withdrawal  01/12/15: pt has CIWA score of 1, expressing anxiety as symptom of withdrawal. Attendees: Patient:    Family:    Physician:  Dr. Sabra Heck 01/12/2015 9:30 AM  Nursing:  Richardson Landry, RN, Ashely Strader,RN 01/12/2015 9:30 AM  Clinical Social Worker: Peri Maris,  [place] 01/12/2015 9:30 AM  Other:  01/12/2015 9:30 AM  Other: Jake Bathe Liaison 01/12/2015 9:30 AM  Other: Lars Pinks, Case Manager 01/12/2015 9:30 AM  Other: Earleen Newport, NP; Catalina Pizza, NP 01/12/2015 9:30 AM  Other:    Other:    Other:    Other:      Norman Clay MSW

## 2015-01-12 NOTE — Progress Notes (Signed)
NUTRITION ASSESSMENT  Pt identified as at risk on the Malnutrition Screen Tool  INTERVENTION: 1. Educated patient on the importance of nutrition and encouraged intake of food and beverages. 2. Discussed weight goals. 3. Supplements: Ensure Enlive po daily, each supplement provides 350 kcal and 20 grams of protein  NUTRITION DIAGNOSIS: Unintentional weight loss related to sub-optimal intake as evidenced by pt report.   Goal: Pt to meet >/= 90% of their estimated nutrition needs.  Monitor:  PO intake  Assessment:  Pt admitted with depression and reports drinking 6 12 oz beers daily and using cocaine.  Pt reports his appetite has not changed. Pt reports 5 lb weight loss, however weight history documentation reveals a 10 lb weight loss. Pt states that "they didn't read the scale right". Pt with noticeable depletion in clavicle region. Pt would like to try Ensure supplements. RD to order.  Height: Ht Readings from Last 1 Encounters:  01/11/15  (1.753 m)    Weight: Wt Readings from Last 1 Encounters:  01/11/15 170 lb (77.111 kg)    Weight Hx: Wt Readings from Last 10 Encounters:  01/11/15 170 lb (77.111 kg)  01/11/15 180 lb (81.647 kg)  01/11/15 180 lb (81.647 kg)    BMI:  Body mass index is 25.09 kg/(m^2). Pt meets criteria for overweight based on current BMI.  Estimated Nutritional Needs: Kcal: 25-30 kcal/kg Protein: > 1 gram protein/kg Fluid: 1 ml/kcal  Diet Order: Diet regular Room service appropriate?: Yes; Fluid consistency:: Thin Pt is also offered choice of unit snacks mid-morning and mid-afternoon.  Pt is eating as desired.   Lab results and medications reviewed.   Tilda Franco, MS, RD, LDN Pager: [phone] After Hours Pager: [phone]

## 2015-01-12 NOTE — Progress Notes (Signed)
Patient ID: Tyler Farrell, male   DOB: 1950/09/19, 64 y.o.   MRN: [id number] Admission note: D:Patient is a voluntary admission in no acute distress for increase depression and suicidal ideation for about a week. Pt reports he has been drinking 6, 12oz beer daily and using cocaine and THC. Pt reports he is currently not taking any medication or under medical supervision. [facility] and plans to return there. Pt goal is find help with his mood swings and how to recognize changes. Pt contracted to come to staff if feeling unsafe. Pt endorses AH calling his name. Pt denies HI/VH and pain  A: Pt admitted to unit per protocol, skin assessment and belonging search done. No skin issues noted. Consent signed by pt. Pt educated on therapeutic milieu rules. Pt was introduced to milieu by nursing staff. Fall risk safety plan explained to the patient. 15 minutes checks started for safety.  R: Pt was receptive to education. Writer offered support.

## 2015-01-12 NOTE — B[facility] Suicide Risk Assessment (Signed)
[facility] Admission Suicide Risk Assessment   Nursing information obtained from:  Patient, Review of record Demographic factors:  Male, Age 64 or older, Low socioeconomic status, Unemployed, Living alone Current Mental Status:  Suicidal ideation indicated by patient Loss Factors:  NA Historical Factors:  NA Risk Reduction Factors:  NA Total Time spent with patient: 45 minutes Principal Problem: MDD (major depressive disorder), recurrent severe, without psychosis Diagnosis:   Patient Active Problem List   Diagnosis Date Noted  . MDD (major depressive disorder), recurrent severe, without psychosis [F33.2] 01/12/2015  . Cocaine dependence without complication [F14.20] 01/12/2015  . Substance induced mood disorder [F19.94] 01/12/2015     Continued Clinical Symptoms:  Alcohol Use Disorder Identification Test Final Score (AUDIT): 14 The "Alcohol Use Disorders Identification Test", Guidelines for Use in Primary Care, Second Edition.  World Science writer [facility]). Score between 0-7:  no or low risk or alcohol related problems. Score between 8-15:  moderate risk of alcohol related problems. Score between 16-19:  high risk of alcohol related problems. Score 20 or above:  warrants further diagnostic evaluation for alcohol dependence and treatment.   CLINICAL FACTORS:   Alcohol/Substance Abuse/Dependencies   Musculoskeletal: Strength & Muscle Tone: within normal limits Gait & Station: normal Patient leans: normal  Psychiatric Specialty Exam: Physical Exam  Review of Systems  HENT: Negative.        Glaucoma  Eyes:       Glaucoma  Respiratory: Negative.   Cardiovascular: Positive for chest pain.  Gastrointestinal: Negative.   Genitourinary: Negative.   Musculoskeletal: Negative.   Skin: Negative.   Neurological: Negative.   Endo/Heme/Allergies: Negative.   Psychiatric/Behavioral: Positive for depression, hallucinations and substance abuse. The patient is nervous/anxious and has  insomnia.     Blood pressure 121/96, pulse 91, temperature 98.2 F (36.8 C), temperature source Oral, resp. rate 16, height 5\' 9"  (1.753 m), weight 77.111 kg (170 lb).Body mass index is 25.09 kg/(m^2).  General Appearance: Disheveled  Eye Solicitor::  Fair  Speech:  Clear and Coherent  Volume:  Decreased  Mood:  Anxious and Depressed  Affect:  Restricted  Thought Process:  Coherent and Goal Directed  Orientation:  Full (Time, Place, and Person)  Thought Content:  symptoms events worries concerns  Suicidal Thoughts:  No  Homicidal Thoughts:  No  Memory:  Immediate;   Fair Recent;   Fair Remote;   Fair  Judgement:  Fair  Insight:  Present and Shallow  Psychomotor Activity:  Restlessness  Concentration:  Fair  Recall:  [employer] of Knowledge:Fair  Language: Fair  Akathisia:  No  Handed:  Right  AIMS (if indicated):     Assets:  Desire for Improvement  Sleep:  Number of Hours: 6.75  Cognition: WNL  ADL's:  Intact     COGNITIVE FEATURES THAT CONTRIBUTE TO RISK:  Closed-mindedness, Polarized thinking and Thought constriction (tunnel vision)    SUICIDE RISK:   Moderate:  Frequent suicidal ideation with limited intensity, and duration, some specificity in terms of plans, no associated intent, good self-control, limited dysphoria/symptomatology, some risk factors present, and identifiable protective factors, including available and accessible social support. 64 Y/o male who states he has been dealing with alcohol and  cocaine use for a long time now. Came to the ED with chest pain. States he knows that the cocaine caused the chest pain and that if he continuee to use he is going to end up dying. He stays at the [facility]. States he wants help as  he is not going to be able to make it if he does not get any help. States he hears voices calling his name. States that he has gotten to a point he has thought about killing himself throwing himself in front of traffic. Denies having been  prescribed medications for depression or anxiety or the voices PLAN OF CARE: Supportive approach/coping skills                               Alcohol dependence; monitor for S/S of withdrawal needing detox                               Cocaine dependence; monitor mood instability from coming off the cocaine                               Work a relapse prevention plan                               Reassess and address any other co morbidities Medical Decision Making:  Review of Psycho-Social Stressors (1), Review or order clinical lab tests (1), Review of Medication Regimen & Side Effects (2) and Review of New Medication or Change in Dosage (2)  I certify that inpatient services furnished can reasonably be expected to improve the patient's condition.   Tyler Farrell A 01/12/2015, 6:40 PM

## 2015-01-12 NOTE — Progress Notes (Signed)
D:Affect is sad/flat at times,mood is depressed. States that goal for today is to work on getting into a drug rehab program. Rates his depression and anxiety as a 9 today. A:Support and encouragement offered. R:Receptive. No complaints of pain or problems at this time.

## 2015-01-12 NOTE — [facility] Group Notes (Signed)
[facility] Group Notes:  (Nursing/MHT/Case Management/Adjunct)  Date:  01/12/2015  Time:  0900 Type of Therapy:  Nurse Education  Participation Level:  Active  Participation Quality:  Appropriate  Affect:  Appropriate  Cognitive:  Alert  Insight:  Appropriate  Engagement in Group:  Developing/Improving  Modes of Intervention:  Education  Summary of Progress/Problems:Pt states that his favorite leisure activity is boating in the waterway in [place] where he has been living.  Ottie Glazier 01/12/2015, 12:21 PM
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
## 2015-01-12 NOTE — [facility] Suicide Risk Assessment (Signed)
[facility] INPATIENT:  Family/Significant Other Suicide Prevention Education  Suicide Prevention Education:  Patient Refusal for Family/Significant Other Suicide Prevention Education: The patient Tyler Farrell has refused to provide written consent for family/significant other to be provided Family/Significant Other Suicide Prevention Education during admission and/or prior to discharge.  Physician notified.  SPE will be reviewed w patient in discharge planning group.    Sallee Lange 01/12/2015, 5:37 PM

## 2015-01-12 NOTE — Tx Team (Signed)
Initial Interdisciplinary Treatment Plan   PATIENT STRESSORS: Financial difficulties Substance abuse   PATIENT STRENGTHS: Ability for insight Average or above average intelligence General fund of knowledge Supportive family/friends   PROBLEM LIST: Problem List/Patient Goals Date to be addressed Date deferred Reason deferred Estimated date of resolution  depression 8/32016     Suicidal ideation 01/11/2015     Substance abuse 01/11/2015     "I need help with my mood swings and how to recognize changes" 01/11/2015                                    DISCHARGE CRITERIA:    PRELIMINARY DISCHARGE PLAN: Attend aftercare/continuing care group Attend 12-step recovery group Return to previous living arrangement  PATIENT/FAMIILY INVOLVEMENT: This treatment plan has been presented to and reviewed with the patient, Tyler Farrell, and/or family member, he patient and family have been given the opportunity to ask questions and make suggestions.  JEHU-APPIAH, Elman Dettman K 01/12/2015, 12:18 AM

## 2015-01-12 NOTE — B[facility] Group Notes (Signed)
[facility] Group Therapy 01/12/2015 1:15pm  Type of Therapy: Mental Health Association Presentation  Participation Level: Active  Participation Quality: Attentive  Affect: Appropriate  Cognitive: Oriented  Insight: Developing/Improving  Engagement in Therapy: Engaged  Modes of Intervention: Discussion, Education and Socialization  Summary of Progress/Problems: Mental Health Association (MHA) Speaker came to talk about his personal journey with substance abuse and addiction. The pt processed ways by which to relate to the speaker. MHA speaker provided handouts and educational information pertaining to groups and services offered by the [facility]. Pt was engaged in speaker's presentation and was receptive to resources provided.    Chad Cordial, LCSWA 01/12/2015 2:25 PM

## 2015-01-12 NOTE — H&P (Signed)
Psychiatric Admission Assessment Adult  Patient Identification: Tyler Farrell MRN:  [id number] Date of Evaluation:  01/12/2015 Chief Complaint:  MDD, Recurrent, Severe Substance Abuse Principal Diagnosis: MDD (major depressive disorder), recurrent severe, without psychosis Diagnosis:   Patient Active Problem List   Diagnosis Date Noted  . MDD (major depressive disorder), recurrent severe, without psychosis [F33.2] 01/12/2015    Priority: High  . Cocaine dependence without complication [D74.12] 87/86/7672    Priority: High  . Substance induced mood disorder [F19.94] 01/12/2015    Priority: High   History of Present Illness::  Upon arrival to ED, Tyler Farrell is an 64 y.o. male who voluntarily presents to [facility] for depression and SI w/ a plan to "walk in front of a car". Pt reports that he doesn't have any medication and was last hospitalized in March, 2016 in [place], [place]. Pt admits that once he was released from the hospital, he did not follow up w/ OP referrals. He indicates that he "need to get on medication and stay on it". Patient is currently living in a shelter, but indicates that he has income from [place] and [place] and children [facility] [place] [facility]or [place]   Pt indicates that he has a hx of alcohol and cocaine abuse and last used both on yesterday morning. In regards to various withdrawal symptoms, he answered "sometimes" or "a couple of times" to almost all of them however; he did not appear to be having any type of current withdrawal symptoms. SA BAL was <5 and UDS tested positive for cocaine.   Pt disclosed having 2 previous suicide attempts of standing on the railing of a bridge. Pt also disclosed having AH of a voice calling his name. Pt was oriented x 4. His speech was logical and coherent. His thought process was normal and focused.   On 01/12/15, pt seen and chart reviewed for initial admission assessment. Pt is calm, cooperative, alert/oriented x4, and  appropriate. Pt reports he is still suicidal but denies any plan at this time. Denies homicidal ideation and psychosis and does not appear to be responding to internal stimuli. Pt does report hearing his mother's voice calling his name sometimes but that this does not interfere with his daily life and that it is chronic. Pt reports serious alcohol abuse but from an intermittent standpoint with binging; pt cannot quantify amount. No tremor, diaphoresis, sever anxiety, headache, or other obvious withdrawal symptoms but we will continue CIWA protocol with librium PRN for now. Pt does report severe anxiety and depression which he feels arise from multiple life stressors primarily including his housing situation (in a shelter, but wants his own place) and financial (although he does have some income with SSI). Denies any family support systems that are significant enough to provide emotional/physical/financial support. Pt receptive to antidepressant and medication management.   Elements:  Location:  Psychiatric. Quality:  Worsening. Severity:  Severe. Timing:  Constant. Duration:  Chronic. Context:  Exacerbation of underlying depression/anxiety secondary to strain regarding housing situation. Associated Signs/Symptoms: Depression Symptoms:  depressed mood, anhedonia, insomnia, psychomotor retardation, fatigue, feelings of worthlessness/guilt, difficulty concentrating, hopelessness, suicidal thoughts with specific plan, anxiety, loss of energy/fatigue, disturbed sleep, weight loss, (Hypo) Manic Symptoms:  Impulsivity, Irritable Mood, Labiality of Mood, Anxiety Symptoms:  Excessive Worry, Psychotic Symptoms:  N/A PTSD Symptoms: N/A Total Time spent with patient: 45 minutes  Past Medical History:  Past Medical History  Diagnosis Date  . Hypertension   . Hep C w/o coma, chronic   .  Glaucoma    History reviewed. No pertinent past surgical history. Family History: History reviewed. No  pertinent family history. Social History:  History  Alcohol Use  . Yes    Comment: occ     History  Drug Use  . Yes  . Special: Marijuana, Cocaine    History   Social History  . Marital Status: Divorced    Spouse Name: N/A  . Number of Children: N/A  . Years of Education: N/A   Social History Main Topics  . Smoking status: Never Smoker   . Smokeless tobacco: Not on file  . Alcohol Use: Yes     Comment: occ  . Drug Use: Yes    Special: Marijuana, Cocaine  . Sexual Activity: Not on file   Other Topics Concern  . None   Social History Narrative   Additional Social History:                          Musculoskeletal: Strength & Muscle Tone: within normal limits Gait & Station: normal Patient leans: N/A  Psychiatric Specialty Exam: Physical Exam  Review of Systems  Psychiatric/Behavioral: Positive for depression, suicidal ideas, hallucinations (Of his mother's voice but nothing negative) and substance abuse. The patient is nervous/anxious and has insomnia.     Blood pressure 121/96, pulse 91, temperature 98.2 F (36.8 C), temperature source Oral, resp. rate 16, height _0  (1.753 m), weight 77.111 kg (170 lb).Body mass index is 25.09 kg/(m^2).  General Appearance: Casual and Disheveled  Eye Contact::  Good  Speech:  Clear and Coherent and Normal Rate  Volume:  Normal  Mood:  Anxious and Depressed  Affect:  Appropriate, Congruent and Depressed  Thought Process:  Coherent and Goal Directed  Orientation:  Full (Time, Place, and Person)  Thought Content:  Rumination  Suicidal Thoughts:  Yes.  with intent/plan  Homicidal Thoughts:  No  Memory:  Immediate;   Fair Recent;   Fair Remote;   Fair  Judgement:  Fair  Insight:  Fair  Psychomotor Activity:  Normal  Concentration:  Good  Recall:  Good  Fund of Knowledge:Fair  Language: Fair  Akathisia:  No  Handed:    AIMS (if indicated):     Assets:  Communication Skills Desire for Improvement  ADL's:   Intact  Cognition: WNL  Sleep:  Number of Hours: 6.75   Risk to Self: Is patient at risk for suicide?: Yes Risk to Others:   Prior Inpatient Therapy:   Prior Outpatient Therapy:    Alcohol Screening: 1. How often do you have a drink containing alcohol?: 4 or more times a week 2. How many drinks containing alcohol do you have on a typical day when you are drinking?: 5 or 6 3. How often do you have six or more drinks on one occasion?: Daily or almost daily Preliminary Score: 6 5. How often during the last year have you failed to do what was normally expected from you becasue of drinking?: Never 6. How often during the last year have you needed a first drink in the morning to get yourself going after a heavy drinking session?: Never 7. How often during the last year have you had a feeling of guilt of remorse after drinking?: Never 8. How often during the last year have you been unable to remember what happened the night before because you had been drinking?: Never 9. Have you or someone else been injured as a result of  your drinking?: No 10. Has a relative or friend or a doctor or another health worker been concerned about your drinking or suggested you cut down?: Yes, during the last year Alcohol Use Disorder Identification Test Final Score (AUDIT): 14 Brief Intervention: Patient declined brief intervention  Allergies:   Allergies  Allergen Reactions  . Penicillins Other (See Comments)    unknown   Lab Results:  Results for orders placed or performed during the hospital encounter of 01/11/15 (from the past 48 hour(s))  Ethanol (ETOH)     Status: None   Collection Time: 01/11/15  2:18 PM  Result Value Ref Range   Alcohol, Ethyl (B) <5 <5 mg/dL    Comment:        LOWEST DETECTABLE LIMIT FOR SERUM ALCOHOL IS 5 mg/dL FOR MEDICAL PURPOSES ONLY   Salicylate level     Status: None   Collection Time: 01/11/15  2:18 PM  Result Value Ref Range   Salicylate Lvl <2.6 2.8 - 30.0 mg/dL   Acetaminophen level     Status: Abnormal   Collection Time: 01/11/15  2:18 PM  Result Value Ref Range   Acetaminophen (Tylenol), Serum <10 (L) 10 - 30 ug/mL    Comment:        THERAPEUTIC CONCENTRATIONS VARY SIGNIFICANTLY. A RANGE OF 10-30 ug/mL MAY BE AN EFFECTIVE CONCENTRATION FOR MANY PATIENTS. HOWEVER, SOME ARE BEST TREATED AT CONCENTRATIONS OUTSIDE THIS RANGE. ACETAMINOPHEN CONCENTRATIONS >150 ug/mL AT 4 HOURS AFTER INGESTION AND >50 ug/mL AT 12 HOURS AFTER INGESTION ARE OFTEN ASSOCIATED WITH TOXIC REACTIONS.   Comprehensive metabolic panel     Status: Abnormal   Collection Time: 01/11/15  2:19 PM  Result Value Ref Range   Sodium 138 135 - 145 mmol/L   Potassium 3.5 3.5 - 5.1 mmol/L   Chloride 103 101 - 111 mmol/L   CO2 25 22 - 32 mmol/L   Glucose, Bld 79 65 - 99 mg/dL   BUN 11 6 - 20 mg/dL   Creatinine, Ser 0.99 0.61 - 1.24 mg/dL   Calcium 9.8 8.9 - 10.3 mg/dL   Total Protein 8.5 (H) 6.5 - 8.1 g/dL   Albumin 4.2 3.5 - 5.0 g/dL   AST 42 (H) 15 - 41 U/L   ALT 34 17 - 63 U/L   Alkaline Phosphatase 63 38 - 126 U/L   Total Bilirubin 1.7 (H) 0.3 - 1.2 mg/dL   GFR calc non Af Amer >60 >60 mL/min   GFR calc Af Amer >60 >60 mL/min    Comment: (NOTE) The eGFR has been calculated using the CKD EPI equation. This calculation has not been validated in all clinical situations. eGFR's persistently <60 mL/min signify possible Chronic Kidney Disease.    Anion gap 10 5 - 15  CBC     Status: None   Collection Time: 01/11/15  2:19 PM  Result Value Ref Range   WBC 10.2 4.0 - 10.5 K/uL   RBC 4.48 4.22 - 5.81 MIL/uL   Hemoglobin 14.6 13.0 - 17.0 g/dL   HCT 43.1 39.0 - 52.0 %   MCV 96.2 78.0 - 100.0 fL   MCH 32.6 26.0 - 34.0 pg   MCHC 33.9 30.0 - 36.0 g/dL   RDW 13.6 11.5 - 15.5 %   Platelets 207 150 - 400 K/uL  Urine rapid drug screen (hosp performed) (Not at [facility])     Status: Abnormal   Collection Time: 01/11/15  4:01 PM  Result Value Ref Range   Opiates NONE DETECTED  NONE DETECTED   Cocaine POSITIVE (A) NONE DETECTED   Benzodiazepines NONE DETECTED NONE DETECTED   Amphetamines NONE DETECTED NONE DETECTED   Tetrahydrocannabinol NONE DETECTED NONE DETECTED   Barbiturates NONE DETECTED NONE DETECTED    Comment:        DRUG SCREEN FOR MEDICAL PURPOSES ONLY.  IF CONFIRMATION IS NEEDED FOR ANY PURPOSE, NOTIFY LAB WITHIN 5 DAYS.        LOWEST DETECTABLE LIMITS FOR URINE DRUG SCREEN Drug Class       Cutoff (ng/mL) Amphetamine      1000 Barbiturate      200 Benzodiazepine   035 Tricyclics       597 Opiates          300 Cocaine          300 THC              50    Current Medications: Current Facility-Administered Medications  Medication Dose Route Frequency Provider Last Rate Last Dose  . acetaminophen (TYLENOL) tablet 650 mg  650 mg Oral Q6H PRN Shuvon B Rankin, NP      . alum & mag hydroxide-simeth (MAALOX/MYLANTA) 200-200-20 MG/5ML suspension 30 mL  30 mL Oral Q4H PRN Shuvon B Rankin, NP      . amLODipine (NORVASC) tablet 10 mg  10 mg Oral Daily Shuvon B Rankin, NP   10 mg at 01/12/15 0827  . chlordiazePOXIDE (LIBRIUM) capsule 25 mg  25 mg Oral Q6H PRN Shuvon B Rankin, NP      . hydrOXYzine (ATARAX/VISTARIL) tablet 25 mg  25 mg Oral Q6H PRN Shuvon B Rankin, NP      . loperamide (IMODIUM) capsule 2-4 mg  2-4 mg Oral PRN Shuvon B Rankin, NP      . magnesium hydroxide (MILK OF MAGNESIA) suspension 30 mL  30 mL Oral Daily PRN Shuvon B Rankin, NP      . multivitamin with minerals tablet 1 tablet  1 tablet Oral Daily Shuvon B Rankin, NP   1 tablet at 01/12/15 0827  . ondansetron (ZOFRAN-ODT) disintegrating tablet 4 mg  4 mg Oral Q6H PRN Shuvon B Rankin, NP      . thiamine (VITAMIN B-1) tablet 100 mg  100 mg Oral Daily Shuvon B Rankin, NP   100 mg at 01/12/15 0827  . traZODone (DESYREL) tablet 50 mg  50 mg Oral QHS PRN Shuvon B Rankin, NP       PTA Medications: Prescriptions prior to admission  Medication Sig Dispense Refill Last Dose  . amLODipine  (NORVASC) 10 MG tablet Take 1 tablet (10 mg total) by mouth daily. 30 tablet 0 Past Week at Unknown time    Previous Psychotropic Medications: Yes   Substance Abuse History in the last 12 months:  Yes.      Consequences of Substance Abuse: Emotional destabilizatino  Results for orders placed or performed during the hospital encounter of 01/11/15 (from the past 72 hour(s))  Ethanol (ETOH)     Status: None   Collection Time: 01/11/15  2:18 PM  Result Value Ref Range   Alcohol, Ethyl (B) <5 <5 mg/dL    Comment:        LOWEST DETECTABLE LIMIT FOR SERUM ALCOHOL IS 5 mg/dL FOR MEDICAL PURPOSES ONLY   Salicylate level     Status: None   Collection Time: 01/11/15  2:18 PM  Result Value Ref Range   Salicylate Lvl <4.1 2.8 - 30.0 mg/dL  Acetaminophen level  Status: Abnormal   Collection Time: 01/11/15  2:18 PM  Result Value Ref Range   Acetaminophen (Tylenol), Serum <10 (L) 10 - 30 ug/mL    Comment:        THERAPEUTIC CONCENTRATIONS VARY SIGNIFICANTLY. A RANGE OF 10-30 ug/mL MAY BE AN EFFECTIVE CONCENTRATION FOR MANY PATIENTS. HOWEVER, SOME ARE BEST TREATED AT CONCENTRATIONS OUTSIDE THIS RANGE. ACETAMINOPHEN CONCENTRATIONS >150 ug/mL AT 4 HOURS AFTER INGESTION AND >50 ug/mL AT 12 HOURS AFTER INGESTION ARE OFTEN ASSOCIATED WITH TOXIC REACTIONS.   Comprehensive metabolic panel     Status: Abnormal   Collection Time: 01/11/15  2:19 PM  Result Value Ref Range   Sodium 138 135 - 145 mmol/L   Potassium 3.5 3.5 - 5.1 mmol/L   Chloride 103 101 - 111 mmol/L   CO2 25 22 - 32 mmol/L   Glucose, Bld 79 65 - 99 mg/dL   BUN 11 6 - 20 mg/dL   Creatinine, Ser 0.99 0.61 - 1.24 mg/dL   Calcium 9.8 8.9 - 10.3 mg/dL   Total Protein 8.5 (H) 6.5 - 8.1 g/dL   Albumin 4.2 3.5 - 5.0 g/dL   AST 42 (H) 15 - 41 U/L   ALT 34 17 - 63 U/L   Alkaline Phosphatase 63 38 - 126 U/L   Total Bilirubin 1.7 (H) 0.3 - 1.2 mg/dL   GFR calc non Af Amer >60 >60 mL/min   GFR calc Af Amer >60 >60 mL/min     Comment: (NOTE) The eGFR has been calculated using the CKD EPI equation. This calculation has not been validated in all clinical situations. eGFR's persistently <60 mL/min signify possible Chronic Kidney Disease.    Anion gap 10 5 - 15  CBC     Status: None   Collection Time: 01/11/15  2:19 PM  Result Value Ref Range   WBC 10.2 4.0 - 10.5 K/uL   RBC 4.48 4.22 - 5.81 MIL/uL   Hemoglobin 14.6 13.0 - 17.0 g/dL   HCT 43.1 39.0 - 52.0 %   MCV 96.2 78.0 - 100.0 fL   MCH 32.6 26.0 - 34.0 pg   MCHC 33.9 30.0 - 36.0 g/dL   RDW 13.6 11.5 - 15.5 %   Platelets 207 150 - 400 K/uL  Urine rapid drug screen (hosp performed) (Not at [facility])     Status: Abnormal   Collection Time: 01/11/15  4:01 PM  Result Value Ref Range   Opiates NONE DETECTED NONE DETECTED   Cocaine POSITIVE (A) NONE DETECTED   Benzodiazepines NONE DETECTED NONE DETECTED   Amphetamines NONE DETECTED NONE DETECTED   Tetrahydrocannabinol NONE DETECTED NONE DETECTED   Barbiturates NONE DETECTED NONE DETECTED    Comment:        DRUG SCREEN FOR MEDICAL PURPOSES ONLY.  IF CONFIRMATION IS NEEDED FOR ANY PURPOSE, NOTIFY LAB WITHIN 5 DAYS.        LOWEST DETECTABLE LIMITS FOR URINE DRUG SCREEN Drug Class       Cutoff (ng/mL) Amphetamine      1000 Barbiturate      200 Benzodiazepine   973 Tricyclics       532 Opiates          300 Cocaine          300 THC              50     Observation Level/Precautions:  15 minute checks  Laboratory:  Labs resulted, reviewed, and stable at this time.   Psychotherapy:  Group therapy, individual therapy, psychoeducation  Medications:  See MAR above  Consultations: None    Discharge Concerns: None    Estimated LOS: 5-7 days  Other:  N/A   Psychological Evaluations: Yes   Treatment Plan Summary: MDD (major depressive disorder), recurrent severe, without psychosis, unstable, treated as below  Daily contact with patient to assess and evaluate symptoms and progress in treatment and  Medication management  Medications: -Continue Librium 32m q6h prn for CIWA protocol -Continue CIWA -Continue Vistaril tied to CIWA -Continue Trazodone 538mqhs prn insomnia -Start Lexapro 104maily for anxiety/depression  Medical Decision Making:  New problem, with additional work up planned, Review of Psycho-Social Stressors (1), Review or order clinical lab tests (1), Review of Medication Regimen & Side Effects (2) and Review of New Medication or Change in Dosage (2)  I certify that inpatient services furnished can reasonably be expected to improve the patient's condition.    WitBenjamine MolaNP-BC 8/4/201610:38 AM I personally assessed the patient, reviewed the physical exam and labs and formulated the treatment plan IrvGeralyn Flash LugSabra Heck.D.

## 2015-01-13 ENCOUNTER — Encounter (HOSPITAL_COMMUNITY): Payer: Self-pay | Admitting: Registered Nurse

## 2015-01-13 LAB — GLUCOSE, CAPILLARY: Glucose-Capillary: 122 mg/dL — ABNORMAL HIGH (ref 65–99)

## 2015-01-13 MED ORDER — ESCITALOPRAM OXALATE 10 MG PO TABS
10.0000 mg | ORAL_TABLET | Freq: Every day | ORAL | Status: DC
  Administered 2015-01-13 – 2015-01-18 (×6): 10 mg via ORAL
  Filled 2015-01-13 (×4): qty 1
  Filled 2015-01-13: qty 5
  Filled 2015-01-13 (×3): qty 1

## 2015-01-13 NOTE — Progress Notes (Signed)
Pt is in the dayroom with the other pts.

## 2015-01-13 NOTE — Progress Notes (Signed)
Patient did not attend group.

## 2015-01-13 NOTE — Progress Notes (Signed)
[facility] MD Progress Note  01/13/2015 3:23 PM Tyler Farrell  MRN:  [id number]   Subjective:  "I'm feeling all right.  Feeling better; got a little rest; didn't get much cause my room mate talked all night."   Patient states that his room mate talks in his sleep and that room mate likes to keep the room hot.  Objective::  Patient seen, chart reviewed, and patient discussed with treatment team.  Patient is tolerating medications without adverse effects.  Patient is attending and participating in group sessions.  States depression is better since he has been abe to get some sleep.  Still anxious But rates Depression and Anxiety both 8/10.  Discussed getting ear plugs to help with noise to help with sleep and setting temp of room at an agreeable temp for both.     Principal Problem: MDD (major depressive disorder), recurrent severe, without psychosis Diagnosis:   Patient Active Problem List   Diagnosis Date Noted  . MDD (major depressive disorder), recurrent severe, without psychosis [F33.2] 01/12/2015  . Cocaine dependence without complication [F14.20] 01/12/2015  . Substance induced mood disorder [F19.94] 01/12/2015  . Alcohol dependence [F10.20] 01/12/2015   Total Time spent with patient: 45 minutes   Past Medical History:  Past Medical History  Diagnosis Date  . Hypertension   . Hep C w/o coma, chronic   . Glaucoma    History reviewed. No pertinent past surgical history. Family History: History reviewed. No pertinent family history. Social History:  History  Alcohol Use  . Yes    Comment: occ     History  Drug Use  . Yes  . Special: Marijuana, Cocaine    History   Social History  . Marital Status: Divorced    Spouse Name: N/A  . Number of Children: N/A  . Years of Education: N/A   Social History Main Topics  . Smoking status: Never Smoker   . Smokeless tobacco: Not on file  . Alcohol Use: Yes     Comment: occ  . Drug Use: Yes    Special: Marijuana, Cocaine  . Sexual  Activity: Not on file   Other Topics Concern  . None   Social History Narrative   Additional History:    Sleep: Fair  Appetite:  Fair   Assessment:   Musculoskeletal: Strength & Muscle Tone: within normal limits Gait & Station: normal Patient leans: N/A   Psychiatric Specialty Exam: Physical Exam  Nursing note and vitals reviewed. Constitutional: He is oriented to person, place, and time.  Neck: Normal range of motion.  Respiratory: Effort normal.  Neurological: He is alert and oriented to person, place, and time.    Review of Systems  Psychiatric/Behavioral: Positive for depression, suicidal ideas, hallucinations and substance abuse. The patient is nervous/anxious and has insomnia.     Blood pressure 128/99, pulse 92, temperature 98.3 F (36.8 C), temperature source Oral, resp. rate 16, height 5\' 9"  (1.753 m), weight 77.111 kg (170 lb).Body mass index is 25.09 kg/(m^2).  General Appearance: Casual  Eye Contact::  Good  Speech:  Clear and Coherent  Volume:  Normal  Mood:  Anxious and Depressed  Affect:  Congruent and Depressed  Thought Process:  Goal Directed  Orientation:  Full (Time, Place, and Person)  Thought Content:  Rumination and Denies any auditoy hallucinations at this time.  States last time was Wednesday  Suicidal Thoughts:  Yes.  with intent/plan  Homicidal Thoughts:  No  Memory:  Immediate;   Good Recent;  Good Remote;   Good  Judgement:  Intact  Insight:  Fair  Psychomotor Activity:  Normal  Concentration:  Fair  Recall:  Good  Fund of Knowledge:Good  Language: Good  Akathisia:  No  Handed:  Right  AIMS (if indicated):     Assets:  Communication Skills Desire for Improvement  ADL's:  Intact  Cognition: WNL  Sleep:  Number of Hours: 6.75     Current Medications: Current Facility-Administered Medications  Medication Dose Route Frequency Provider Last Rate Last Dose  . acetaminophen (TYLENOL) tablet 650 mg  650 mg Oral Q6H PRN Shuvon  B Rankin, NP      . alum & mag hydroxide-simeth (MAALOX/MYLANTA) 200-200-20 MG/5ML suspension 30 mL  30 mL Oral Q4H PRN Shuvon B Rankin, NP      . amLODipine (NORVASC) tablet 10 mg  10 mg Oral Daily Shuvon B Rankin, NP   10 mg at 01/13/15 0815  . chlordiazePOXIDE (LIBRIUM) capsule 25 mg  25 mg Oral Q6H PRN Shuvon B Rankin, NP      . dorzolamide (TRUSOPT) 2 % ophthalmic solution 1 drop  1 drop Both Eyes TID Rachael Fee, MD   1 drop at 01/13/15 1200  . escitalopram (LEXAPRO) tablet 10 mg  10 mg Oral Daily Beau Fanny, FNP      . feeding supplement (ENSURE ENLIVE) (ENSURE ENLIVE) liquid 237 mL  237 mL Oral Q24H Tilda Franco, RD   237 mL at 01/12/15 2000  . hydrOXYzine (ATARAX/VISTARIL) tablet 25 mg  25 mg Oral Q6H PRN Shuvon B Rankin, NP      . loperamide (IMODIUM) capsule 2-4 mg  2-4 mg Oral PRN Shuvon B Rankin, NP      . magnesium hydroxide (MILK OF MAGNESIA) suspension 30 mL  30 mL Oral Daily PRN Shuvon B Rankin, NP      . multivitamin with minerals tablet 1 tablet  1 tablet Oral Daily Shuvon B Rankin, NP   1 tablet at 01/13/15 0815  . ondansetron (ZOFRAN-ODT) disintegrating tablet 4 mg  4 mg Oral Q6H PRN Shuvon B Rankin, NP      . thiamine (VITAMIN B-1) tablet 100 mg  100 mg Oral Daily Shuvon B Rankin, NP   100 mg at 01/13/15 0815  . traZODone (DESYREL) tablet 50 mg  50 mg Oral QHS PRN Shuvon B Rankin, NP        Lab Results:  Results for orders placed or performed during the hospital encounter of 01/11/15 (from the past 48 hour(s))  Urine rapid drug screen (hosp performed) (Not at [facility])     Status: Abnormal   Collection Time: 01/11/15  4:01 PM  Result Value Ref Range   Opiates NONE DETECTED NONE DETECTED   Cocaine POSITIVE (A) NONE DETECTED   Benzodiazepines NONE DETECTED NONE DETECTED   Amphetamines NONE DETECTED NONE DETECTED   Tetrahydrocannabinol NONE DETECTED NONE DETECTED   Barbiturates NONE DETECTED NONE DETECTED    Comment:        DRUG SCREEN FOR MEDICAL PURPOSES ONLY.  IF  CONFIRMATION IS NEEDED FOR ANY PURPOSE, NOTIFY LAB WITHIN 5 DAYS.        LOWEST DETECTABLE LIMITS FOR URINE DRUG SCREEN Drug Class       Cutoff (ng/mL) Amphetamine      1000 Barbiturate      200 Benzodiazepine   200 Tricyclics       300 Opiates          300 Cocaine  300 THC              50     Physical Findings: AIMS: Facial and Oral Movements Muscles of Facial Expression: None, normal Lips and Perioral Area: None, normal Jaw: None, normal Tongue: None, normal,Extremity Movements Upper (arms, wrists, hands, fingers): None, normal Lower (legs, knees, ankles, toes): None, normal, Trunk Movements Neck, shoulders, hips: None, normal, Overall Severity Severity of abnormal movements (highest score from questions above): None, normal Incapacitation due to abnormal movements: None, normal Patient's awareness of abnormal movements (rate only patient's report): No Awareness, Dental Status Current problems with teeth and/or dentures?: No Does patient usually wear dentures?: No  CIWA:  CIWA-Ar Total: 1 COWS:     Treatment Plan Summary: Daily contact with patient to assess and evaluate symptoms and progress in treatment and Medication management   1. Admit for crisis management and stabilization 2. Medication management to reduce current symptoms to bale line and improve the patient's overall level of functioning 3. Treat health problems as indicated 4. Develop treatment plan to decrease risk of relapse upon discharge and the need for readmission. 5. Psycho-social education regarding relapse prevention and self care. 6. Health care follow up as needed for medical problems 7. Restart home medications where appropriate.    -Continue Librium 25 mg Q 6h prn for CIWA protocol -Continue CIWA -Continue Vistaril tied to CIWA -Continue Trazodone 50 mg Q hs prn insomnia -Continue Lexapro 10 mg daily for anxiety/depression  Will give patient earplugs to help with the noise of his  room mate talking in his sleep at night to see if this helps patient sleep better at night.    Continue with current treatment plan; no changes at this time  Medical Decision Making:  Review of Psycho-Social Stressors (1), Review or order clinical lab tests (1), Review and summation of old records (2), Review of Last Therapy Session (1), Independent Review of image, tracing or specimen (2) and Review of Medication Regimen & Side Effects (2)   Rankin, Shuvon, FNP-BC 01/13/2015, 3:23 PM I agree with assessment and plan Madie Reno A. Dub Mikes, M.D.

## 2015-01-13 NOTE — [facility] Group Notes (Signed)
[facility] LCSW Group Therapy 01/13/2015 1:15pm  Type of Therapy: Group Therapy- Feelings Around Relapse and Recovery  Participation Level: Active   Participation Quality:  Appropriate  Affect:  Appropriate  Cognitive: Alert and Oriented   Insight:  Developing   Engagement in Therapy: Developing/Improving and Engaged   Modes of Intervention: Clarification, Confrontation, Discussion, Education, Exploration, Limit-setting, Orientation, Problem-solving, Rapport Building, Dance movement psychotherapist, Socialization and Support  Summary of Progress/Problems: The topic for today was feelings about relapse. The group discussed what relapse prevention is to them and identified triggers that they are on the path to relapse. Members also processed their feeling towards relapse and were able to relate to common experiences. Group also discussed coping skills that can be used for relapse prevention.  Pt participated with prompting. He expressed a desire to achieve sobriety as his health is suffering and he wants to be "the person he knows he can be." Pt describes getting necessary treatment and engaging actively in a recovery plan as necessary steps to avoid relapse. He also spoke about his need to find other positive activities to counter cravings alcohol when feeling overwhelmed.    Therapeutic Modalities:   Cognitive Behavioral Therapy Solution-Focused Therapy Assertiveness Training Relapse Prevention Therapy    Lamar Sprinkles [phone] 01/13/2015 4:34 PM\

## 2015-01-13 NOTE — Plan of Care (Signed)
Problem: Alteration in mood & ability to function due to Goal: LTG-Pt reports reduction in suicidal thoughts (Patient reports reduction in suicidal thoughts and is able to verbalize a safety plan for whenever patient is feeling suicidal)  Outcome: Progressing Pt states he feels like he's getting a little stronger every day . He denies active SI today and rates his feelings of depression, hopelessness and anxiety " 4/4/9", respectively.

## 2015-01-13 NOTE — Progress Notes (Signed)
Recreation Therapy Notes  Date: 08.05.16 Time: 9:30 am Location: [address] Dayroom  Group Topic: Stress Management  Goal Area(s) Addresses:  Patient will verbalize importance of using healthy stress management.  Patient will identify positive emotions associated with healthy stress management.   Intervention: Stress Management  Activity :  Progressive Muscle Relaxation.  LRT introduced and educated patients on stress management technique of progressive muscle relaxation.  A script was used to deliver the technique to the patients.  Patients were asked to follow script read aloud by LRT to engage in practicing the stress management technique.  Education:  Stress Management, Discharge Planning.   Education Outcome: Acknowledges edcuation/In group clarification offered/Needs additional education  Clinical Observations/Feedback: Patient did not attend group.   Caroll Rancher, LRT/CTRS         Caroll Rancher A 01/13/2015 12:49 PM

## 2015-01-13 NOTE — Progress Notes (Signed)
[name] is seen OOB UAL o the [address] hall  Today. Initially, he was reluctant to get OOB because he said he slept poorly last night.  He did complete his daily assessment and on it he wrote he denied experiencing SI today and he rated his depression, hopelessness and anxiety " 4/4/9", respectively.    A He is quiet, endorses a sad and flat affect and sits quietly in the dayroom at this time.    R Safety is in place and poc cont.

## 2015-01-13 NOTE — Progress Notes (Signed)
Patient ID: Tyler Farrell, male   DOB: 07-12-1950, 64 y.o.   MRN: [id number]  D: Patient pleasant on approach tonight. Lying in bed talking with roommate on approach tonight. Patient reports that he has been eating well when I offered him some Ensure and did not want it at this time. States that his mood has been improved and currently denies any SI.  A:Staff will monitor on q 15 minute checks, follow treatment plan, and give meds as ordered.  R: Cooperative on the unit tonight.

## 2015-01-13 NOTE — [facility] Group Notes (Signed)
[facility] LCSW Aftercare Discharge Planning Group Note  01/13/2015 8:45 AM  Pt did not attend, declined invitation.   Chad Cordial, LCSWA 01/13/2015 11:16 AM

## 2015-01-14 DIAGNOSIS — F10230 Alcohol dependence with withdrawal, uncomplicated: Secondary | ICD-10-CM | POA: Diagnosis not present

## 2015-01-14 LAB — GLUCOSE, CAPILLARY: Glucose-Capillary: 106 mg/dL — ABNORMAL HIGH (ref 65–99)

## 2015-01-14 NOTE — Progress Notes (Signed)
[facility] MD Progress Note  01/14/2015 1:46 PM Tyler Farrell  MRN:  [id number]   Subjective:  Patient states that he is feeling a little better.  States that he did not sleep well last night because of his room mate talking in his sleep.  "I didn't sleep none last night cause of his talking.  I forgot to put in the ear plugs.  He likes the room hot.  I like it cold.  I like it cold to keep the germs down and I sleep better when the room is cool.  He wants the air on 70 or above I like it on 64 or be low.  I tried to compromise with 63 but it wants to keep it turned up and I just can't sleep."  I'm feeling all right.  Feeling better; got a little rest; didn't get much cause my room mate talked all night."   Patient states that his room mate talks in his sleep and that room mate likes to keep the room hot.  Objective::  Patient seen, chart reviewed, and patient discussed with nursing staff.  Patient is tolerating medications without adverse effects.  Patient states that he did not attend group this morning because he was tired where he did not get any sleep this morning and was not able to go to breakfast because he was tired.  Patient feels that his depression and anxiety has lessened but would be much better if he could get some sleep.  Discussed moving to another room; patient feels that he would do better in another room.    Met with nursing to discuss moving patient to another room as soon as a room became available.       Principal Problem: MDD (major depressive disorder), recurrent severe, without psychosis Diagnosis:   Patient Active Problem List   Diagnosis Date Noted  . MDD (major depressive disorder), recurrent severe, without psychosis [F33.2] 01/12/2015  . Cocaine dependence without complication [N02.72] 53/66/4403  . Substance induced mood disorder [F19.94] 01/12/2015  . Alcohol dependence [F10.20] 01/12/2015   Total Time spent with patient: 20 minutes   Past Medical History:  Past  Medical History  Diagnosis Date  . Hypertension   . Hep C w/o coma, chronic   . Glaucoma    History reviewed. No pertinent past surgical history. Family History: History reviewed. No pertinent family history. Social History:  History  Alcohol Use  . Yes    Comment: occ     History  Drug Use  . Yes  . Special: Marijuana, Cocaine    History   Social History  . Marital Status: Divorced    Spouse Name: N/A  . Number of Children: N/A  . Years of Education: N/A   Social History Main Topics  . Smoking status: Never Smoker   . Smokeless tobacco: Not on file  . Alcohol Use: Yes     Comment: occ  . Drug Use: Yes    Special: Marijuana, Cocaine  . Sexual Activity: Not on file   Other Topics Concern  . None   Social History Narrative   Additional History:    Sleep: Fair  Appetite:  Fair, Improving   Assessment:   Musculoskeletal: Strength & Muscle Tone: within normal limits Gait & Station: normal Patient leans: N/A   Psychiatric Specialty Exam: Physical Exam  Nursing note and vitals reviewed. Constitutional: He is oriented to person, place, and time.  Neck: Normal range of motion.  Respiratory: Effort normal.  Neurological:  He is alert and oriented to person, place, and time.    Review of Systems  Psychiatric/Behavioral: Positive for depression (Improving) and substance abuse. Suicidal ideas: Denies at this time  Hallucinations: Denies any at this time. The patient is nervous/anxious (Improving) and has insomnia.     Blood pressure 149/113, pulse 81, temperature 98.2 F (36.8 C), temperature source Oral, resp. rate 18, height 5' 9"  (1.753 m), weight 77.111 kg (170 lb).Body mass index is 25.09 kg/(m^2).  General Appearance: Casual  Eye Contact::  Good  Speech:  Clear and Coherent  Volume:  Normal  Mood:  Anxious and Depressed  Affect:  Congruent and Depressed  Thought Process:  Goal Directed  Orientation:  Full (Time, Place, and Person)  Thought Content:   Rumination and Denies any auditoy hallucinations at this time.  States last time was Wednesday  Suicidal Thoughts:  Yes.  with intent/plan  Homicidal Thoughts:  No  Memory:  Immediate;   Good Recent;   Good Remote;   Good  Judgement:  Intact  Insight:  Fair  Psychomotor Activity:  Normal  Concentration:  Fair  Recall:  Good  Fund of Knowledge:Good  Language: Good  Akathisia:  No  Handed:  Right  AIMS (if indicated):     Assets:  Communication Skills Desire for Improvement  ADL's:  Intact  Cognition: WNL  Sleep:  Number of Hours: 6.75     Current Medications: Current Facility-Administered Medications  Medication Dose Route Frequency Provider Last Rate Last Dose  . acetaminophen (TYLENOL) tablet 650 mg  650 mg Oral Q6H PRN Georgine Wiltse B Tanis Burnley, NP      . alum & mag hydroxide-simeth (MAALOX/MYLANTA) 200-200-20 MG/5ML suspension 30 mL  30 mL Oral Q4H PRN Amarachukwu Lakatos B Maha Fischel, NP      . amLODipine (NORVASC) tablet 10 mg  10 mg Oral Daily Jourdain Guay B Deasiah Hagberg, NP   10 mg at 01/14/15 1610  . chlordiazePOXIDE (LIBRIUM) capsule 25 mg  25 mg Oral Q6H PRN Disney Ruggiero B Keeanna Villafranca, NP      . dorzolamide (TRUSOPT) 2 % ophthalmic solution 1 drop  1 drop Both Eyes TID Nicholaus Bloom, MD   1 drop at 01/14/15 1254  . escitalopram (LEXAPRO) tablet 10 mg  10 mg Oral Daily Benjamine Mola, FNP   10 mg at 01/14/15 9604  . feeding supplement (ENSURE ENLIVE) (ENSURE ENLIVE) liquid 237 mL  237 mL Oral Q24H Clayton Bibles, RD   237 mL at 01/12/15 2000  . hydrOXYzine (ATARAX/VISTARIL) tablet 25 mg  25 mg Oral Q6H PRN Sonia Bromell B Halona Amstutz, NP      . loperamide (IMODIUM) capsule 2-4 mg  2-4 mg Oral PRN Kiwanna Spraker B Oyuki Hogan, NP      . magnesium hydroxide (MILK OF MAGNESIA) suspension 30 mL  30 mL Oral Daily PRN Larua Collier B Arianne Klinge, NP      . multivitamin with minerals tablet 1 tablet  1 tablet Oral Daily Delainey Winstanley B Quintina Hakeem, NP   1 tablet at 01/14/15 5409  . ondansetron (ZOFRAN-ODT) disintegrating tablet 4 mg  4 mg Oral Q6H PRN Kemo Spruce B Alysha Doolan, NP       . thiamine (VITAMIN B-1) tablet 100 mg  100 mg Oral Daily Saben Donigan B Diamante Rubin, NP   100 mg at 01/14/15 8119  . traZODone (DESYREL) tablet 50 mg  50 mg Oral QHS PRN Ara Grandmaison B Shanik Brookshire, NP        Lab Results:  Results for orders placed or performed during the hospital encounter of 01/11/15 (  from the past 48 hour(s))  Glucose, capillary     Status: Abnormal   Collection Time: 01/12/15 10:04 PM  Result Value Ref Range   Glucose-Capillary 122 (H) 65 - 99 mg/dL  Glucose, capillary     Status: Abnormal   Collection Time: 01/14/15  6:28 AM  Result Value Ref Range   Glucose-Capillary 106 (H) 65 - 99 mg/dL    Physical Findings: AIMS: Facial and Oral Movements Muscles of Facial Expression: None, normal Lips and Perioral Area: None, normal Jaw: None, normal Tongue: None, normal,Extremity Movements Upper (arms, wrists, hands, fingers): None, normal Lower (legs, knees, ankles, toes): None, normal, Trunk Movements Neck, shoulders, hips: None, normal, Overall Severity Severity of abnormal movements (highest score from questions above): None, normal Incapacitation due to abnormal movements: None, normal Patient's awareness of abnormal movements (rate only patient's report): No Awareness, Dental Status Current problems with teeth and/or dentures?: No Does patient usually wear dentures?: No  CIWA:  CIWA-Ar Total: 0 COWS:     Treatment Plan Summary: Daily contact with patient to assess and evaluate symptoms and progress in treatment and Medication management   1. Admit for crisis management and stabilization 2. Medication management to reduce current symptoms to bale line and improve the patient's overall level of functioning 3. Treat health problems as indicated 4. Develop treatment plan to decrease risk of relapse upon discharge and the need for readmission. 5. Psycho-social education regarding relapse prevention and self care. 6. Health care follow up as needed for medical problems 7. Restart  home medications where appropriate.    -Continue Librium 25 mg Q 6 hr prn for CIWA protocol -Continue CIWA -Continue Vistaril tied to CIWA -Continue Trazodone 50 mg Q hs prn insomnia -Continue Lexapro 10 mg daily for anxiety/depression  Patient is unable to sleep related to having a room mate who talks in his sleep and like keeping the room to hot.  Discussed with nursing staff moving the patient to another room as soon as another room became available  Will continue with current treatment plan; no changes at this time  Medical Decision Making:  Established Problem, Stable/Improving (1), Review of Psycho-Social Stressors (1), Review of Last Therapy Session (1) and Independent Review of image, tracing or specimen (2)   Azari Hasler, FNP-BC 01/14/2015, 1:46 PM

## 2015-01-14 NOTE — Progress Notes (Signed)
Patient did not attend group.

## 2015-01-14 NOTE — Progress Notes (Signed)
Psychoeducational Group Note  Date: 01/14/2015 Time:  1015  Group Topic/Focus:  Identifying Needs:   The focus of this group is to help patients identify their personal needs that have been historically problematic and identify healthy behaviors to address their needs.  Participation Level:  Active  Participation Quality:  Appropriate  Affect:  Appropriate  Cognitive:  Oriented  Insight:  Improving  Engagement in Group:  Engaged  Additional Comments:  Attended and partisipated  Zoi Devine A  

## 2015-01-14 NOTE — [facility] Group Notes (Signed)
[facility] LCSW Group Therapy    Description of Group:   Learn how to identify obstacles, self-sabotaging and enabling behaviors, what are they, why do we do them and what needs do these behaviors meet? Discuss unhealthy relationships and how to have positive healthy boundaries with those that sabotage and enable. Explore aspects of self-sabotage and enabling in yourself and how to limit these self-destructive behaviors in everyday life.  Type of Therapy:  Group Therapy: Avoiding Self-Sabotaging and Enabling Behaviors  Participation Level:  Did Not Attend, invited but sleeping    Therapeutic Goals: 1. Patient will identify one obstacle that relates to self-sabotage and enabling behaviors 2. Patient will identify one personal self-sabotaging or enabling behavior they did prior to admission 3. Patient able to establish a plan to change the above identified behavior they did prior to admission:  4. Patient will demonstrate ability to communicate their needs through discussion and/or role plays.   Therapeutic Modalities:   Cognitive Behavioral Therapy Person-Centered Therapy Motivational Interviewing  Santa Genera, LCSW Clinical Social Worker

## 2015-01-14 NOTE — Progress Notes (Signed)
.  Psychoeducational Group Note    Date: 01/14/2015 Time: 0930   Goal Setting Purpose of Group: To be able to set a goal that is measurable and that can be accomplished in one day Participation Level:  Did not attend   Tyler Farrell

## 2015-01-14 NOTE — Progress Notes (Signed)
D.  Pt in bed on approach, minimal interaction.  Pt did not attend evening wrap up group,.remained in bed.  No distress reported or observed.  A.  Support and encouragement offered  R.  Pt remains safe on the unit, will continue to monitor.

## 2015-01-14 NOTE — Progress Notes (Signed)
Tyler Farrell says he did not sleep last night " again", even though he was given ear plugs to use. HE remains tired, sad and stays in his bed a lot, complaining of " not feeling good".   A He completed  His daily assessment and on it he rated his depression, hopelessness and anxiety " 6/6/6", respectively and he denied SI today. He takes  His medications as  Scheduled . He attends his groups and says he is " taking it all in".    R Safety is in place and poc cont.

## 2015-01-15 DIAGNOSIS — F1020 Alcohol dependence, uncomplicated: Secondary | ICD-10-CM

## 2015-01-15 DIAGNOSIS — F1910 Other psychoactive substance abuse, uncomplicated: Secondary | ICD-10-CM

## 2015-01-15 LAB — GLUCOSE, CAPILLARY
GLUCOSE-CAPILLARY: 87 mg/dL (ref 65–99)
Glucose-Capillary: 84 mg/dL (ref 65–99)

## 2015-01-15 NOTE — [facility] Group Notes (Signed)
[facility] LCSW Group Therapy  01/15/2015   1:15 PM   Type of Therapy:  Group Therapy  Participation Level:  Active  Participation Quality:  Appropriate and Attentive  Affect:  Appropriate, Flat  Cognitive:  Alert and Appropriate  Insight:  Developing/Improving and Engaged  Engagement in Therapy:  Developing/Improving and Engaged  Modes of Intervention:  Clarification, Confrontation, Discussion, Education, Exploration, Limit-setting, Orientation, Problem-solving, Rapport Building, Dance movement psychotherapist, Socialization and Support  Summary of Progress/Problems: The main focus of today's process group was to identify the patient's current support system and decide on other supports that can be put in place.  An emphasis was placed on using counselor, doctor, therapy groups, 12-step groups, and problem-specific support groups to expand supports, as well as doing something different than has been done before.  Pt shared that his kids are supportive to him but he tries not to burden them.  Pt actively participated and was engaged in group discussion.    Reyes Ivan, LCSW 01/15/2015 2:10 PM

## 2015-01-15 NOTE — Progress Notes (Signed)
Patient ID: Tyler Farrell, male   DOB: Jul 10, 1950, 64 y.o.   MRN: [id number] [facility] MD Progress Note  01/15/2015 1:22 PM Tyler Farrell  MRN:  [id number]   Subjective:  Patient reports a 5/10 depression but denies suicidal/homicidal ideations.  He denies withdrawal symptoms and cravings.  [name] is hoping to go to a rehab facility and then to the [employer].  Denies physical pain.  Objective::  Patient seen, chart reviewed, and patient discussed with nursing staff.  Patient is tolerating medications without adverse effects.  Patient has attended groups with participation.  [name] interacts appropriately on the unit with staff and patients.  Principal Problem: MDD (major depressive disorder), recurrent severe, without psychosis Diagnosis:   Patient Active Problem List   Diagnosis Date Noted  . Alcohol dependence with uncomplicated withdrawal [F10.230]   . MDD (major depressive disorder), recurrent severe, without psychosis [F33.2] 01/12/2015  . Cocaine dependence without complication [F14.20] 01/12/2015  . Alcohol dependence [F10.20] 01/12/2015   Total Time spent with patient: 25 minutes   Past Medical History:  Past Medical History  Diagnosis Date  . Hypertension   . Hep C w/o coma, chronic   . Glaucoma    History reviewed. No pertinent past surgical history. Family History: History reviewed. No pertinent family history. Social History:  History  Alcohol Use  . Yes    Comment: occ     History  Drug Use  . Yes  . Special: Marijuana, Cocaine    History   Social History  . Marital Status: Divorced    Spouse Name: N/A  . Number of Children: N/A  . Years of Education: N/A   Social History Main Topics  . Smoking status: Never Smoker   . Smokeless tobacco: Not on file  . Alcohol Use: Yes     Comment: occ  . Drug Use: Yes    Special: Marijuana, Cocaine  . Sexual Activity: Not on file   Other Topics Concern  . None   Social History Narrative   Additional History:     Sleep: Fair  Appetite:  Fair, Improving   Assessment:   Musculoskeletal: Strength & Muscle Tone: within normal limits Gait & Station: normal Patient leans: N/A   Psychiatric Specialty Exam: Physical Exam  Nursing note and vitals reviewed. Constitutional: He is oriented to person, place, and time.  Neck: Normal range of motion.  Respiratory: Effort normal.  Neurological: He is alert and oriented to person, place, and time.    Review of Systems  Psychiatric/Behavioral: Positive for depression (Improving) and substance abuse. Suicidal ideas: Denies at this time  Hallucinations: Denies any at this time. The patient is nervous/anxious (Improving) and has insomnia.     Blood pressure 149/113, pulse 81, temperature 98.2 F (36.8 C), temperature source Oral, resp. rate 18, height  (1.753 m), weight 77.111 kg (170 lb).Body mass index is 25.09 kg/(m^2).  General Appearance: Casual  Eye Contact::  Good  Speech:  Clear and Coherent  Volume:  Normal  Mood:  Depressed  Affect:  Congruent  Thought Process:  Goal Directed  Orientation:  Full (Time, Place, and Person)  Thought Content:  Rumination and Denies any auditoy hallucinations at this time.  States last time was Wednesday  Suicidal Thoughts:  No  Homicidal Thoughts:  No  Memory:  Immediate;   Good Recent;   Good Remote;   Good  Judgement:  Intact  Insight:  Fair  Psychomotor Activity:  Normal  Concentration:  Fair  Recall:  Good  Fund of Knowledge:Good  Language: Good  Akathisia:  No  Handed:  Right  AIMS (if indicated):     Assets:  Communication Skills Desire for Improvement  ADL's:  Intact  Cognition: WNL  Sleep:  Number of Hours: 6.25     Current Medications: Current Facility-Administered Medications  Medication Dose Route Frequency Provider Last Rate Last Dose  . acetaminophen (TYLENOL) tablet 650 mg  650 mg Oral Q6H PRN Shuvon B Rankin, NP      . alum & mag hydroxide-simeth (MAALOX/MYLANTA)  200-200-20 MG/5ML suspension 30 mL  30 mL Oral Q4H PRN Shuvon B Rankin, NP      . amLODipine (NORVASC) tablet 10 mg  10 mg Oral Daily Shuvon B Rankin, NP   10 mg at 01/15/15 1205  . dorzolamide (TRUSOPT) 2 % ophthalmic solution 1 drop  1 drop Both Eyes TID Rachael Fee, MD   1 drop at 01/15/15 1204  . escitalopram (LEXAPRO) tablet 10 mg  10 mg Oral Daily Beau Fanny, FNP   10 mg at 01/15/15 1205  . feeding supplement (ENSURE ENLIVE) (ENSURE ENLIVE) liquid 237 mL  237 mL Oral Q24H Tilda Franco, RD   237 mL at 01/12/15 2000  . magnesium hydroxide (MILK OF MAGNESIA) suspension 30 mL  30 mL Oral Daily PRN Shuvon B Rankin, NP      . multivitamin with minerals tablet 1 tablet  1 tablet Oral Daily Shuvon B Rankin, NP   1 tablet at 01/15/15 1205  . thiamine (VITAMIN B-1) tablet 100 mg  100 mg Oral Daily Shuvon B Rankin, NP   100 mg at 01/15/15 1204  . traZODone (DESYREL) tablet 50 mg  50 mg Oral QHS PRN Shuvon B Rankin, NP        Lab Results:  Results for orders placed or performed during the hospital encounter of 01/11/15 (from the past 48 hour(s))  Glucose, capillary     Status: Abnormal   Collection Time: 01/14/15  6:28 AM  Result Value Ref Range   Glucose-Capillary 106 (H) 65 - 99 mg/dL  Glucose, capillary     Status: None   Collection Time: 01/15/15 11:48 AM  Result Value Ref Range   Glucose-Capillary 87 65 - 99 mg/dL    Physical Findings: AIMS: Facial and Oral Movements Muscles of Facial Expression: None, normal Lips and Perioral Area: None, normal Jaw: None, normal Tongue: None, normal,Extremity Movements Upper (arms, wrists, hands, fingers): None, normal Lower (legs, knees, ankles, toes): None, normal, Trunk Movements Neck, shoulders, hips: None, normal, Overall Severity Severity of abnormal movements (highest score from questions above): None, normal Incapacitation due to abnormal movements: None, normal Patient's awareness of abnormal movements (rate only patient's report):  No Awareness, Dental Status Current problems with teeth and/or dentures?: No Does patient usually wear dentures?: No  CIWA:  CIWA-Ar Total: 0 COWS:     Treatment Plan Summary: Daily contact with patient to assess and evaluate symptoms and progress in treatment and Medication management  Major Depressive Disorder: Continue Lexapro 10 mg daily for anxiety/depression Continue Trazodone 50 mg at bedtime PRN sleep issues Individual and group therapy with encouragement of new coping skills for depression.  Alcohol dependence: Denies withdrawal symptoms Substance abuse counseling provided  Medical Decision Making:  Established Problem, Stable/Improving (1), Review of Psycho-Social Stressors (1), Review of Last Therapy Session (1) and Independent Review of image, tracing or specimen (2)   LORD, JAMISON, PMH-NP 01/15/2015, 1:22 PM Patient seen face-to-face for psychiatric evaluation, chart reviewed and  case discussed with the physician extender and developed treatment plan. Reviewed the information documented and agree with the treatment plan. Corena Pilgrim, MD

## 2015-01-15 NOTE — Progress Notes (Signed)
D.  Pt pleasant on approach sitting in dayroom.  Accepted his ensure this evening and then returned to bed before group.  Minimal interaction on unit.  Denies SI/HI/hallucinations at this time.  No complaints voiced.  A.  Support and encouragement offered  R.  Pt remains safe on unit, will continue to monitor.

## 2015-01-15 NOTE — Progress Notes (Signed)
[facility] Group Notes:  (Nursing/MHT/Case Management/Adjunct) Did not attend group, remained in bed resting.   Madaline Savage 01/15/2015, 7:43 PM

## 2015-01-15 NOTE — Plan of Care (Signed)
Problem: Consults Goal: Depression Patient Education See Patient Education Module for education specifics.  Outcome: Progressing This nurse spoke with patient today about his depression. The importance of learning what his stressors are, taking his medication properly and consistently and maintaining adequate sleep. He states he knows these things....its " the doing that 's hard...".

## 2015-01-15 NOTE — Progress Notes (Signed)
Psychoeducational Group Note  Date:  01/15/2015 Time:  1015  Group Topic/Focus:  Making Healthy Choices:   The focus of this group is to help patients identify negative/unhealthy choices they were using prior to admission and identify positive/healthier coping strategies to replace them upon discharge.  Participation Level:  Active  Participation Quality:  Appropriate  Affect:  Appropriate  Cognitive:  Oriented  Insight:  Improving  Engagement in Group:  Engaged  Additional Comments:    Bing Duffey A 01/15/2015 

## 2015-01-15 NOTE — Progress Notes (Signed)
Psychoeducational Group Note  Date: 01/15/2015 Time:  0930 Group Topic/Focus:  Gratefulness:  The focus of this group is to help patients identify what two things they are most grateful for in their lives. What helps ground them and to center them on their work to their recovery.  Participation Level:  Did not attend    Tyler Farrell

## 2015-01-16 DIAGNOSIS — I10 Essential (primary) hypertension: Secondary | ICD-10-CM

## 2015-01-16 DIAGNOSIS — F332 Major depressive disorder, recurrent severe without psychotic features: Principal | ICD-10-CM

## 2015-01-16 DIAGNOSIS — F10230 Alcohol dependence with withdrawal, uncomplicated: Secondary | ICD-10-CM

## 2015-01-16 LAB — GLUCOSE, CAPILLARY
Glucose-Capillary: 95 mg/dL (ref 65–99)
Glucose-Capillary: 124 mg/dL — ABNORMAL HIGH (ref 65–99)

## 2015-01-16 MED ORDER — ACAMPROSATE CALCIUM 333 MG PO TBEC
666.0000 mg | DELAYED_RELEASE_TABLET | Freq: Three times a day (TID) | ORAL | Status: DC
  Administered 2015-01-16 – 2015-01-18 (×6): 666 mg via ORAL
  Filled 2015-01-16: qty 2
  Filled 2015-01-16: qty 30
  Filled 2015-01-16 (×6): qty 2
  Filled 2015-01-16: qty 30
  Filled 2015-01-16: qty 2
  Filled 2015-01-16: qty 30
  Filled 2015-01-16 (×2): qty 2

## 2015-01-16 NOTE — Plan of Care (Signed)
Problem: Ineffective individual coping Goal: STG: Patient will participate in after care plan Outcome: Progressing Pt reports to nurse and social worker that he would like to attend an inpatient rehab program post discharge

## 2015-01-16 NOTE — Progress Notes (Signed)
Recreation Therapy Notes  Date: 08.08.16 Time: 930 am Location: [address] Group Room  Group Topic: Stress Management  Goal Area(s) Addresses:  Patient will verbalize importance of using healthy stress management.  Patient will identify positive emotions associated with healthy stress management.   Intervention: Stress Management  Activity : Guided Training and development officer. LRT will introduce and instruct patients on the stress management technique of guided imagery. Patients were asked to follow a long with a script read a loud by LRT to participate in the stress management technique of guided imagery.  Education: Stress Management, Discharge Planning.   Education Outcome: Acknowledges edcuation/In group clarification offered/Needs additional education  Clinical Observations/Feedback: Patient did not attend group.   Caroll Rancher, LRT/CTRS         Caroll Rancher A 01/16/2015 4:34 PM

## 2015-01-16 NOTE — Plan of Care (Signed)
Problem: Alteration in mood Goal: LTG-Patient reports reduction in suicidal thoughts (Patient reports reduction in suicidal thoughts and is able to verbalize a safety plan for whenever patient is feeling suicidal)  Outcome: Progressing Pt denies SI at this time     

## 2015-01-16 NOTE — [facility] Group Notes (Signed)
[facility] LCSW Group Therapy  01/16/2015 1:15pm  Type of Therapy:  Group Therapy vercoming Obstacles  Pt attended group briefly, however left very early in group discussion and did not return.   Chad Cordial, LCSWA 01/16/2015 2:32 PM

## 2015-01-16 NOTE — Progress Notes (Signed)
D: Pt denies SI/HI/AVH. Pt is pleasant and cooperative. Pt stated he was doing ok. Pt stayed in bed duration of the evening.   A: Pt was offered support and encouragement. Pt was given scheduled medications. Pt was encourage to attend groups. Q 15 minute checks were done for safety.   R: Pt is taking medication. Pt has no complaints at this time .Pt receptive to treatment and safety maintained on unit.

## 2015-01-16 NOTE — Consult Note (Signed)
[facility] Medical Consultation  Tyler Farrell WUJ:[id number] DOB: Jan 10, 1951 DOA: 01/11/2015 PCP: No PCP Per Patient   Requesting physician: Dr Jama Flavors Date of consultation: 01/16/2015 Reason for consultation: HTN management  Impression/Recommendations Principal Problem:   MDD (major depressive disorder), recurrent severe, without psychosis Active Problems:   Cocaine dependence without complication   Alcohol dependence   Alcohol dependence with uncomplicated withdrawal    1. Hypertension. Patient having a history of hypertension reporting taking Norvasc 10 mg by mouth daily at home. His diastolic blood pressures remain elevated in the upper 90s. Clonidine may be helpful as an adjunctive medication in setting of alcohol withdrawal to reduce noradrenergic symptoms. Would recommend adding clonidine 0.1 mg Po q daily to his regimen.   I.  Chief Complaint: Hypertension  HPI: Patient is a 64 year old gentleman with a past medical history of drug and alcohol abuse admitted to the inpatient psychiatric facility on 01/12/2015 when he presented with suicidal ideations. Prior to admission he had been binging on alcohol and cocaine. He reported having previous suicide attempts. He has a history of hypertension and takes 10 mg of Norvasc at home. During his inpatient stay systolic blood pressures have mostly been in the 130s with diastolic blood pressures in the upper 90s. He reports having some "anxiousness" which he attributes to alcohol withdrawal. Otherwise he has not hallucinated nor has been agitated. He reports tolerating by mouth intake.    Review of Systems:  Constitutional:  No weight loss, night sweats, Fevers, chills, fatigue.  HEENT:  No headaches, Difficulty swallowing,Tooth/dental problems,Sore throat,  No sneezing, itching, ear ache, nasal congestion, post nasal drip,  Cardio-vascular:  No chest pain, Orthopnea, PND, swelling in lower extremities, anasarca, dizziness,  palpitations  GI:  No heartburn, indigestion, abdominal pain, nausea, vomiting, diarrhea, change in bowel habits, loss of appetite  Resp:  No shortness of breath with exertion or at rest. No excess mucus, no productive cough, No non-productive cough, No coughing up of blood.No change in color of mucus.No wheezing.No chest wall deformity  Skin:  no rash or lesions.  GU:  no dysuria, change in color of urine, no urgency or frequency. No flank pain.  Musculoskeletal:  No joint pain or swelling. No decreased range of motion. No back pain.  Psych:  No change in mood or affect. No depression or anxiety. No memory loss.   Past Medical History  Diagnosis Date  . Hypertension   . Hep C w/o coma, chronic   . Glaucoma    History reviewed. No pertinent past surgical history. Social History:  reports that he has never smoked. He does not have any smokeless tobacco history on file. He reports that he drinks alcohol. He reports that he uses illicit drugs (Marijuana and Cocaine).  Allergies  Allergen Reactions  . Penicillins Other (See Comments)    unknown   History reviewed. No pertinent family history.  Prior to Admission medications   Medication Sig Start Date End Date Taking? Authorizing Provider  amLODipine (NORVASC) 10 MG tablet Take 1 tablet (10 mg total) by mouth daily. 11/24/14   Riki Sheer, PA-C   Physical Exam: Blood pressure 131/97, pulse 88, temperature 98.4 F (36.9 C), temperature source Oral, resp. rate 18, height 5\' 9"  (1.753 m), weight 77.111 kg (170 lb). Filed Vitals:   01/16/15 0609  BP: 131/97  Pulse: 88  Temp:   Resp:      General:  Patient is in no acute distress, he does not appear to have evidence of  acute withdrawal, he is calm pleasant and cooperative  Eyes: Pupils are equal round and reactive to light extra ocular movement is intact, no scleritis  Neck: Supple symmetrical no jugular venous distention  Cardiovascular: Regular rate and rhythm normal  S1-S2 no murmurs rubs or gallops  Respiratory: Normal respiratory effort, lungs are clear to auscultation bilaterally  Abdomen: Soft nontender nondistended  Skin: No rashes  Musculoskeletal: No edema  Psychiatric: Patient is calm, cooperative, alert and oriented 3  Neurologic: Nonfocal  Labs on Admission:  Basic Metabolic Panel:  Recent Labs Lab 01/11/15 0222 01/11/15 1419  NA 136 138  K 3.6 3.5  CL 104 103  CO2 20* 25  GLUCOSE 88 79  BUN 14 11  CREATININE 1.41* 0.99  CALCIUM 9.3 9.8   Liver Function Tests:  Recent Labs Lab 01/11/15 1419  AST 42*  ALT 34  ALKPHOS 63  BILITOT 1.7*  PROT 8.5*  ALBUMIN 4.2   No results for input(s): LIPASE, AMYLASE in the last 168 hours. No results for input(s): AMMONIA in the last 168 hours. CBC:  Recent Labs Lab 01/11/15 0222 01/11/15 1419  WBC 8.8 10.2  NEUTROABS 6.7  --   HGB 13.4 14.6  HCT 39.3 43.1  MCV 95.6 96.2  PLT 196 207   Cardiac Enzymes: No results for input(s): CKTOTAL, CKMB, CKMBINDEX, TROPONINI in the last 168 hours. BNP: Invalid input(s): POCBNP CBG:  Recent Labs Lab 01/14/15 0628 01/15/15 1148 01/15/15 1655 01/16/15 0729 01/16/15 1715  GLUCAP 106* 87 84 95 124*    Radiological Exams on Admission: No results found.    Time spent: 45 min  Jeralyn Bennett [facility] Pager [phone]  If 7PM-7AM, please contact night-coverage [email or web address] Password [facility] 01/16/2015, 6:55 PM

## 2015-01-16 NOTE — [facility] Group Notes (Signed)
[facility] LCSW Aftercare Discharge Planning Group Note  01/16/2015 8:45 AM  Participation Quality: Alert, Appropriate and Oriented  Mood/Affect: Appropriate  Depression Rating: 5  Anxiety Rating: 5  Thoughts of Suicide: Pt denies SI/HI  Will you contract for safety? Yes  Current AVH: Pt denies  Plan for Discharge/Comments: Pt attended discharge planning group and actively participated in group. CSW discussed suicide prevention education with the group and encouraged them to discuss discharge planning and any relevant barriers. Pt reports feeling "good" and inquires about his [facility] referral. CSW informed him that referral was made on Friday and would be followed up on today.  Transportation Means: Pt reports access to transportation  Supports: No supports mentioned at this time  Chad Cordial, LCSWA 01/16/2015 9:17 AM

## 2015-01-16 NOTE — Progress Notes (Signed)
Did not attend group, remained in bed resting

## 2015-01-16 NOTE — Progress Notes (Signed)
Patient ID: Tyler Farrell, male   DOB: 1951/04/04, 64 y.o.   MRN: [id number] [facility] MD Progress Note  01/16/2015 4:21 PM Tyler Farrell  MRN:  [id number]   Subjective:  Patient states he is feeling better, less depressed, although still tending to feel " low ". Describes some ongoing cravings for alcohol, but states he is motivated in treatment and sobriety, and is focused on going to an inpatient rehab after discharge. Denies medication side effects at this time..  Objective::   Patient case discussed with treatment team and patient seen . Patient is a 64 year old man with history of depression and substance dependence, primarily alcohol dependence, and cocaine abuse . At this time denies symptoms of alcohol WDL and does not present tremulous, diaphoretic or restless.  He does, as above, report some ongoing cravings for alcohol, and identifies cravings as a trigger for relapses in the past. We discussed medication options and agrees to Campral trial. Depression is described as improved  But still present . No SI at this time. Behavior on unit calm, in good control. Some group participation.    Principal Problem: MDD (major depressive disorder), recurrent severe, without psychosis Diagnosis:   Patient Active Problem List   Diagnosis Date Noted  . Alcohol dependence with uncomplicated withdrawal [F10.230]   . MDD (major depressive disorder), recurrent severe, without psychosis [F33.2] 01/12/2015  . Cocaine dependence without complication [F14.20] 01/12/2015  . Alcohol dependence [F10.20] 01/12/2015   Total Time spent with patient: 25 minutes   Past Medical History:  Past Medical History  Diagnosis Date  . Hypertension   . Hep C w/o coma, chronic   . Glaucoma    History reviewed. No pertinent past surgical history. Family History: History reviewed. No pertinent family history. Social History:  History  Alcohol Use  . Yes    Comment: occ     History  Drug Use  . Yes  . Special:  Marijuana, Cocaine    History   Social History  . Marital Status: Divorced    Spouse Name: N/A  . Number of Children: N/A  . Years of Education: N/A   Social History Main Topics  . Smoking status: Never Smoker   . Smokeless tobacco: Not on file  . Alcohol Use: Yes     Comment: occ  . Drug Use: Yes    Special: Marijuana, Cocaine  . Sexual Activity: Not on file   Other Topics Concern  . None   Social History Narrative   Additional History:    Sleep: Fair  Appetite:   Improved    Assessment:   Musculoskeletal: Strength & Muscle Tone: within normal limits Gait & Station: normal Patient leans: N/A   Psychiatric Specialty Exam: Physical Exam  Nursing note and vitals reviewed. Constitutional: He is oriented to person, place, and time.  Neck: Normal range of motion.  Respiratory: Effort normal.  Neurological: He is alert and oriented to person, place, and time.    Review of Systems  Psychiatric/Behavioral: Positive for depression (Improving) and substance abuse. Suicidal ideas: Denies at this time  Hallucinations: Denies any at this time. The patient is nervous/anxious (Improving) and has insomnia.     Blood pressure 131/97, pulse 88, temperature 98.4 F (36.9 C), temperature source Oral, resp. rate 18, height 5\' 9"  (1.753 m), weight 170 lb (77.111 kg).Body mass index is 25.09 kg/(m^2).  General Appearance: Fairly Groomed  Patent attorney::  Good  Speech:  Clear and Coherent  Volume:  Normal  Mood:  Depressed, but states he is feeling better   Affect:  Congruent  Thought Process:  Goal Directed  Orientation:  Full (Time, Place, and Person)  Thought Content:   Denies hallucinations , no delusions, not internally preoccupied   Suicidal Thoughts:  No- currently denies any SI   Homicidal Thoughts:  No  Memory:  Immediate;   Good Recent;   Good Remote;   Good  Judgement:  Intact  Insight:   Improving   Psychomotor Activity:  Normal  Concentration:  Fair  Recall:   Good  Fund of Knowledge:Good  Language: Good  Akathisia:  No  Handed:  Right  AIMS (if indicated):     Assets:  Communication Skills Desire for Improvement  ADL's:  Intact  Cognition: WNL  Sleep:  Number of Hours: 5.25     Current Medications: Current Facility-Administered Medications  Medication Dose Route Frequency Provider Last Rate Last Dose  . acetaminophen (TYLENOL) tablet 650 mg  650 mg Oral Q6H PRN Shuvon B Rankin, NP   650 mg at 01/16/15 1558  . alum & mag hydroxide-simeth (MAALOX/MYLANTA) 200-200-20 MG/5ML suspension 30 mL  30 mL Oral Q4H PRN Shuvon B Rankin, NP      . amLODipine (NORVASC) tablet 10 mg  10 mg Oral Daily Shuvon B Rankin, NP   10 mg at 01/16/15 0846  . dorzolamide (TRUSOPT) 2 % ophthalmic solution 1 drop  1 drop Both Eyes TID Rachael Fee, MD   1 drop at 01/16/15 1206  . escitalopram (LEXAPRO) tablet 10 mg  10 mg Oral Daily Beau Fanny, FNP   10 mg at 01/16/15 0846  . feeding supplement (ENSURE ENLIVE) (ENSURE ENLIVE) liquid 237 mL  237 mL Oral Q24H Tilda Franco, RD   237 mL at 01/15/15 1956  . magnesium hydroxide (MILK OF MAGNESIA) suspension 30 mL  30 mL Oral Daily PRN Shuvon B Rankin, NP      . multivitamin with minerals tablet 1 tablet  1 tablet Oral Daily Shuvon B Rankin, NP   1 tablet at 01/16/15 0846  . thiamine (VITAMIN B-1) tablet 100 mg  100 mg Oral Daily Shuvon B Rankin, NP   100 mg at 01/16/15 0846  . traZODone (DESYREL) tablet 50 mg  50 mg Oral QHS PRN Shuvon B Rankin, NP        Lab Results:  Results for orders placed or performed during the Farrell encounter of 01/11/15 (from the past 48 hour(s))  Glucose, capillary     Status: None   Collection Time: 01/15/15 11:48 AM  Result Value Ref Range   Glucose-Capillary 87 65 - 99 mg/dL  Glucose, capillary     Status: None   Collection Time: 01/15/15  4:55 PM  Result Value Ref Range   Glucose-Capillary 84 65 - 99 mg/dL  Glucose, capillary     Status: None   Collection Time: 01/16/15  7:29  AM  Result Value Ref Range   Glucose-Capillary 95 65 - 99 mg/dL   Comment 1 Document in Chart     Physical Findings: AIMS: Facial and Oral Movements Muscles of Facial Expression: None, normal Lips and Perioral Area: None, normal Jaw: None, normal Tongue: None, normal,Extremity Movements Upper (arms, wrists, hands, fingers): None, normal Lower (legs, knees, ankles, toes): None, normal, Trunk Movements Neck, shoulders, hips: None, normal, Overall Severity Severity of abnormal movements (highest score from questions above): None, normal Incapacitation due to abnormal movements: None, normal Patient's awareness of abnormal movements (rate only patient's report): No  Awareness, Dental Status Current problems with teeth and/or dentures?: No Does patient usually wear dentures?: No  CIWA:  CIWA-Ar Total: 0 COWS:      Assessment-  64 year old man with history of Alcohol Dependence, Cocaine Abuse, Depression. Currently improving and not presenting with symptoms of WDL at this time. Tolerating medications well. Describes ongoing cravings for alcohol and is interested in Campral trial . Tolerating Lexapro trial well thus far.  Treatment Plan Summary: Daily contact with patient to assess and evaluate symptoms and progress in treatment and Medication management  Continue Lexapro 10 mg daily for anxiety/depression Continue Trazodone 50 mg at bedtime PRN sleep issues Start Campral 666 mgrs TID for alcohol cravings- side effects and rationale discussed. Patient wants to go to a residential rehab setting after discharge- treatment team/CSW looking into options On Norvasc for HTN and on Dorzolamide for glaucoma.  Substance abuse counseling provided  Medical Decision Making:  Established Problem, Stable/Improving (1), Review of Psycho-Social Stressors (1), Review of Last Therapy Session (1) and Independent Review of image, tracing or specimen (2)   Nehemiah Massed,  MD 01/16/2015, 4:21 PM

## 2015-01-16 NOTE — Progress Notes (Signed)
Patient ID: Tyler Farrell, male   DOB: 20-Jan-1951, 64 y.o.   MRN: [id number]   Pt currently presents with a flat affect and cooperative behavior. Per self inventory, pt rates depression, hopelessness and anxiety at a 5. Pt's daily goal is "my release plan to rehab" and they intend to do so by "work with my social worker to get in-patient care." Pt reports fair sleep, a fair appetite, low energy and good concentration.   Pt provided with medications per providers orders. Pt's labs and vitals were monitored throughout the day. Pt supported emotionally and encouraged to express concerns and questions. Pt educated on medications.  Pt's safety ensured with 15 minute and environmental checks. Pt currently denies SI/HI and A/V hallucinations. Pt verbally agrees to seek staff if SI/HI or A/VH occurs and to consult with staff before acting on these thoughts. Pt writes "My inpatient is vital important to me, I never had it" Per Child psychotherapist, pt will be awaiting placement at [facility]. Will continue POC.

## 2015-01-16 NOTE — Plan of Care (Signed)
Problem: Alteration in mood & ability to function due to Goal: STG-Patient will attend groups Outcome: Not Progressing Pt has not attended group for night shift this weekend

## 2015-01-17 LAB — GLUCOSE, CAPILLARY
GLUCOSE-CAPILLARY: 134 mg/dL — AB (ref 65–99)
Glucose-Capillary: 115 mg/dL — ABNORMAL HIGH (ref 65–99)

## 2015-01-17 MED ORDER — CLONIDINE HCL 0.1 MG PO TABS
0.1000 mg | ORAL_TABLET | Freq: Every day | ORAL | Status: DC
  Administered 2015-01-18: 0.1 mg via ORAL
  Filled 2015-01-17 (×2): qty 1
  Filled 2015-01-17: qty 5
  Filled 2015-01-17: qty 1

## 2015-01-17 NOTE — Progress Notes (Signed)
Patient ID: Tyler Farrell, male   DOB: [birth year], 64 y.o.   MRN: [id number]  Pt currently presents with a flat affect and anxious behavior. Pt has pleasant interactions with other pts and staff. Per self inventory, pt rates depression, hopelessness and anxiety at a 5. Pt's daily goal is to "release plan to enter inpatient rehab upon release" and they intend to do so by "meet with my social worker." Pt reports fair sleep, a fair appetite, low energy and good concentration.   Pt provided with medications per providers orders. Pt's labs and vitals were monitored throughout the day. Pt supported emotionally and encouraged to express concerns and questions. Pt educated on medications.  Pt's safety ensured with 15 minute and environmental checks. Pt currently denies SI/HI and A/V hallucinations. Pt verbally agrees to seek staff if SI/HI or A/VH occurs and to consult with staff before acting on these thoughts. Pt is vested in setting short term and long term goals for his recovery per discussion in group this morning. Will continue POC.

## 2015-01-17 NOTE — Progress Notes (Signed)
Patient ID: Tyler Farrell, male   DOB: August 13, 1950, 64 y.o.   MRN: [id number] Adult Psychoeducational Group Note  Date:  01/17/2015 Time: 09:00am  Group Topic/Focus:  Goals Group:   The focus of this group is to help patients establish daily goals to achieve during treatment and discuss how the patient can incorporate goal setting into their daily lives to aide in recovery.  Participation Level:  Active  Participation Quality:  Appropriate, Attentive and Sharing  Affect:  Appropriate  Cognitive:  Alert and Oriented  Insight: Good  Engagement in Group:  Engaged and Supportive  Modes of Intervention:  Activity, Education, Orientation and Support  Additional Comments:  Pt able to accomplish goal for recovery to accomplish his goal of recovery. Pt identifies "going to inpatient rehab" as one of his goals.   Aurora Mask 01/17/2015, 11:28 AM

## 2015-01-17 NOTE — Tx Team (Addendum)
Interdisciplinary Treatment Plan Update (Adult) Date: 01/17/2015   Date: 01/17/2015 9:17 AM  Progress in Treatment:  Attending groups: Yes  Participating in groups: Yes  Taking medication as prescribed: Yes  Tolerating medication: Yes  Family/Significant othe contact made: No, Pt declines family contact Patient understands diagnosis: Yes AEB requesting referrals for residential substance abuse treatement Discussing patient identified problems/goals with staff: Yes  Medical problems stabilized or resolved: Yes  Denies suicidal/homicidal ideation: Yes Patient has not harmed self or Others: Yes   New problem(s) identified: None identified at this time.   Discharge Plan or Barriers: CSW will assess for appropriate discharge plan and relevant barriers.  8/9: Pt requesting referrals to residential treatment facilities. CSW making appropriate referrals. 8/10: pt will return to [facility] while awaiting bed at [facility]  Additional comments: n/a   Reason for Continuation of Hospitalization:  Depression Medication stabilization Suicidal ideation Withdrawal symptoms  Estimated length of stay: 3-5 days  Review of initial/current patient goals per problem list:   1.  Goal(s): Patient will participate in aftercare plan  Met:  Yes  Target date: 3-5 days from date of admission   As evidenced by: Patient will participate within aftercare plan AEB aftercare provider and housing plan at discharge being identified.   01/12/15: Pt is requesting referrals to long-term substance abuse treatment.   01/17/15: CSW continuing to make referrals and seek treatment bed.  01/18/15: Pt will return to [facility] while waiting for bed at [facility]  2.  Goal (s): Patient will exhibit decreased depressive symptoms and suicidal ideations.  Met:  Yes  Target date: 3-5 days from date of admission   As evidenced by: Patient will utilize self rating of depression at 3 or below and demonstrate decreased signs of  depression or be deemed stable for discharge by MD. 01/12/15: Pt was admitted with symptoms of depression, rating 10/10. Pt continues to present with flat affect and depressive symptoms.  Pt will demonstrate decreased symptoms of depression and rate depression at 3/10 or lower prior to discharge. 8/916: Pt rates depression at 5/10. Affect and mood improving. 01/18/15: Pt reports that his depression is at 3/10 this morning.  4.  Goal(s): Patient will demonstrate decreased signs of withdrawal due to substance abuse  Met:  Yes  Target date:  As evidenced by: Patient will produce a CIWA/COWS score of 0, have stable vitals signs, and no symptoms of withdrawal  01/12/15: pt has CIWA score of 1, expressing anxiety as symptom of withdrawal.  01/17/15: Per chart and Pt report, no signs of withdrawal indicated. CIWA score of 0.  Attendees:  Patient:    Family:    Physician: Dr. Parke Poisson, MD  01/17/2015 9:02 AM  Nursing: Lars Pinks, RN Case manager  01/17/2015 9:02 AM  Clinical Social Worker Norman Clay, MSW 01/17/2015 9:02 AM  Other: Lucinda Dell, Beverly Sessions Liasion 01/17/2015 9:02 AM  Clinical:  Marcella Dubs, RN; Jennye Moccasin, RN 01/17/2015 9:02 AM  Other: Darrol Angel, RN Charge Nurse 01/17/2015 9:02 AM  Other:       Peri Maris, Latanya Presser MSW

## 2015-01-17 NOTE — Progress Notes (Signed)
[facility] Group Notes:  (Nursing/MHT/Case Management/Adjunct)  Date:  01/17/2015  Time:  10:03 PM  Type of Therapy:  Psychoeducational Skills  Participation Level:  Did Not Attend  Participation Quality:  Did not attend  Affect:  Did not attend  Cognitive:  Did not attend  Insight:  None  Engagement in Group:  Did not attend  Modes of Intervention:  Did not attend  Summary of Progress/Problems: Patient did not attend tonight.   Tyler Farrell 01/17/2015, 10:03 PM

## 2015-01-17 NOTE — Progress Notes (Signed)
D: Upon arrival to [facility] room I observed him to be asleep under his covers. He woke up and I introduced myself as his nurse for the night. He states he had a "good day". His goal today was "to get to an outpatient program". He is hopeful that this will happen soon so that he may continue his treatment and recovery after discharge. He states he will not be able to attend group tonight because he has a headache (Frontal 7/10). I encouraged him to attend once he is medicated if his headache pain subsides. He is agreeable under these conditions. He states he went to pet therapy earlier today and that was better than having an actual person come to see him. His cravings are minimal at this time.  A: Encouraged participation in group tonight. Medicated patient per orders for headache pain.  R: Will continue to monitor patient for safety and signs/symptoms of withdrawal as well as medication effectiveness.

## 2015-01-17 NOTE — Plan of Care (Signed)
Problem: Alteration in mood & ability to function due to Goal: STG-Patient will attend groups Outcome: Progressing Pt has attended all groups this shift

## 2015-01-17 NOTE — Progress Notes (Signed)
Recreation Therapy Notes  Animal-Assisted Activity (AAA) Program Checklist/Progress Notes Patient Eligibility Criteria Checklist & Daily Group note for Rec Tx Intervention  Date: 08.09.16 Time: 2:45 pm Location: [address]  AAA/T Program Assumption of Risk Form signed by Patient/ or Parent Legal Guardian yes  Patient is free of allergies or sever asthma yes  Patient reports no fear of animals yes  Patient reports no history of cruelty to animalsyes  Patient understands his/her participation is voluntary yes  Patient washes hands before animal contact yes  Patient washes hands after animal contact yes  Behavioral Response: Engaged  Education: Charity fundraiser, Appropriate Animal Interaction   Education Outcome: Acknowledges understanding/In group clarification offered/Needs additional education.   Clinical Observations/Feedback: Patient attended group.   Caroll Rancher, LRT/CTRS         Caroll Rancher A 01/17/2015 4:25 PM

## 2015-01-17 NOTE — Progress Notes (Addendum)
Patient ID: Tyler Farrell, male   DOB: 18-May-1951, 64 y.o.   MRN: [id number] [facility] MD Progress Note  01/17/2015 8:27 AM Tyler Farrell  MRN:  [id number]   Subjective:  Reports ongoing gradual improvement of mood . Denies current medication side effects and states medications have been helpful. Describes improving cravings. Objective::   Patient case discussed with treatment team and patient seen . Currently tolerating medications well.  States he is better and feeling less depressed, although does report some lingering depression and has described mood as 5/10. Remains highly  motivated in going to as residential  rehab after discharge. As discussed with staff /CSW [facility] has not accepted him- [facility]  May be an option.  Cravings for alcohol are described as better on Campral, and thus far has had no side effects.  Behavior on unit calm, in good control.  Going to groups . Appreciate  Hospitalist consultation/feedback. Patient has tended to have elevated diastolic BP readings - recommendation is to add clonidine 0.1 mgrs QDAY to regimen.    Principal Problem: MDD (major depressive disorder), recurrent severe, without psychosis Diagnosis:   Patient Active Problem List   Diagnosis Date Noted  . Alcohol dependence with uncomplicated withdrawal [F10.230]   . MDD (major depressive disorder), recurrent severe, without psychosis [F33.2] 01/12/2015  . Cocaine dependence without complication [F14.20] 01/12/2015  . Alcohol dependence [F10.20] 01/12/2015   Total Time spent with patient: 25 minutes   Past Medical History:  Past Medical History  Diagnosis Date  . Hypertension   . Hep C w/o coma, chronic   . Glaucoma    History reviewed. No pertinent past surgical history. Family History: History reviewed. No pertinent family history. Social History:  History  Alcohol Use  . Yes    Comment: occ     History  Drug Use  . Yes  . Special: Marijuana, Cocaine    History   Social History  . Marital  Status: Divorced    Spouse Name: N/A  . Number of Children: N/A  . Years of Education: N/A   Social History Main Topics  . Smoking status: Never Smoker   . Smokeless tobacco: Not on file  . Alcohol Use: Yes     Comment: occ  . Drug Use: Yes    Special: Marijuana, Cocaine  . Sexual Activity: Not on file   Other Topics Concern  . None   Social History Narrative   Additional History:    Sleep:  Improved   Appetite:   Improved    Assessment:   Musculoskeletal: Strength & Muscle Tone: within normal limits Gait & Station: normal Patient leans: N/A   Psychiatric Specialty Exam: Physical Exam  Nursing note and vitals reviewed. Constitutional: He is oriented to person, place, and time.  Neck: Normal range of motion.  Respiratory: Effort normal.  Neurological: He is alert and oriented to person, place, and time.    Review of Systems  Psychiatric/Behavioral: Positive for depression (Improving) and substance abuse. Suicidal ideas: Denies at this time  Hallucinations: Denies any at this time. The patient is nervous/anxious (Improving) and has insomnia.   at this time denies  Headache, denies chest pain,  Denies shortness of breath   Blood pressure 111/60, pulse 83, temperature 98.9 F (37.2 C), temperature source Oral, resp. rate 18, height  (1.753 m), weight 170 lb (77.111 kg).Body mass index is 25.09 kg/(m^2).  General Appearance:  Grooming is improved  Eye Contact::  Good  Speech:  Clear and Coherent  Volume:  Normal  Mood:  Depressed, but states he is feeling better   Affect:   Mildly constricted but reactive   Thought Process:  Goal Directed  Orientation:  Full (Time, Place, and Person)  Thought Content:   Denies hallucinations , no delusions, not internally preoccupied   Suicidal Thoughts:  No- currently denies any SI   Homicidal Thoughts:  No  Memory:  Immediate;   Good Recent;   Good Remote;   Good  Judgement:  Intact  Insight:   Improving   Psychomotor  Activity:  Normal  Concentration:  Fair  Recall:  Good  Fund of Knowledge:Good  Language: Good  Akathisia:  No  Handed:  Right  AIMS (if indicated):     Assets:  Communication Skills Desire for Improvement  ADL's:  Intact  Cognition: WNL  Sleep:  Number of Hours: 5.75     Current Medications: Current Facility-Administered Medications  Medication Dose Route Frequency Provider Last Rate Last Dose  . acamprosate (CAMPRAL) tablet 666 mg  666 mg Oral TID WC Craige Cotta, MD   666 mg at 01/17/15 0612  . acetaminophen (TYLENOL) tablet 650 mg  650 mg Oral Q6H PRN Shuvon B Rankin, NP   650 mg at 01/16/15 1558  . alum & mag hydroxide-simeth (MAALOX/MYLANTA) 200-200-20 MG/5ML suspension 30 mL  30 mL Oral Q4H PRN Shuvon B Rankin, NP      . amLODipine (NORVASC) tablet 10 mg  10 mg Oral Daily Shuvon B Rankin, NP   10 mg at 01/16/15 0846  . dorzolamide (TRUSOPT) 2 % ophthalmic solution 1 drop  1 drop Both Eyes TID Rachael Fee, MD   1 drop at 01/16/15 1719  . escitalopram (LEXAPRO) tablet 10 mg  10 mg Oral Daily Beau Fanny, FNP   10 mg at 01/16/15 0846  . feeding supplement (ENSURE ENLIVE) (ENSURE ENLIVE) liquid 237 mL  237 mL Oral Q24H Tilda Franco, RD   237 mL at 01/15/15 1956  . magnesium hydroxide (MILK OF MAGNESIA) suspension 30 mL  30 mL Oral Daily PRN Shuvon B Rankin, NP      . multivitamin with minerals tablet 1 tablet  1 tablet Oral Daily Shuvon B Rankin, NP   1 tablet at 01/16/15 0846  . thiamine (VITAMIN B-1) tablet 100 mg  100 mg Oral Daily Shuvon B Rankin, NP   100 mg at 01/16/15 0846  . traZODone (DESYREL) tablet 50 mg  50 mg Oral QHS PRN Shuvon B Rankin, NP        Lab Results:  Results for orders placed or performed during the hospital encounter of 01/11/15 (from the past 48 hour(s))  Glucose, capillary     Status: None   Collection Time: 01/15/15 11:48 AM  Result Value Ref Range   Glucose-Capillary 87 65 - 99 mg/dL  Glucose, capillary     Status: None   Collection  Time: 01/15/15  4:55 PM  Result Value Ref Range   Glucose-Capillary 84 65 - 99 mg/dL  Glucose, capillary     Status: None   Collection Time: 01/16/15  7:29 AM  Result Value Ref Range   Glucose-Capillary 95 65 - 99 mg/dL   Comment 1 Document in Chart   Glucose, capillary     Status: Abnormal   Collection Time: 01/16/15  5:15 PM  Result Value Ref Range   Glucose-Capillary 124 (H) 65 - 99 mg/dL   Comment 1 Document in Chart   Glucose, capillary  Status: Abnormal   Collection Time: 01/17/15  7:53 AM  Result Value Ref Range   Glucose-Capillary 134 (H) 65 - 99 mg/dL   Comment 1 Document in Chart     Physical Findings: AIMS: Facial and Oral Movements Muscles of Facial Expression: None, normal Lips and Perioral Area: None, normal Jaw: None, normal Tongue: None, normal,Extremity Movements Upper (arms, wrists, hands, fingers): None, normal Lower (legs, knees, ankles, toes): None, normal, Trunk Movements Neck, shoulders, hips: None, normal, Overall Severity Severity of abnormal movements (highest score from questions above): None, normal Incapacitation due to abnormal movements: None, normal Patient's awareness of abnormal movements (rate only patient's report): No Awareness, Dental Status Current problems with teeth and/or dentures?: No Does patient usually wear dentures?: No  CIWA:  CIWA-Ar Total: 0 COWS:      Assessment-   Remains somewhat depressed but improved compared to admission. No SI. Tolerating Lexapro well. Cravings for alcohol have subsided on Campral. Hospitalist recommends adding low dose clonidine to Norvasc to improve HTN.  Patient highly motivated in going to an inpatient rehab after discharge- treatment team looking into options .   Treatment Plan Summary: Daily contact with patient to assess and evaluate symptoms and progress in treatment and Medication management  Continue Lexapro 10 mg daily for anxiety/depression Continue Trazodone 50 mg at bedtime PRN sleep  issues Continue  Campral 666 mgrs TID for alcohol cravings- side effects and rationale discussed. Patient wants to go to a residential rehab setting after discharge- treatment team/CSW looking into options On Norvasc and now also on Clonidine  for HTN and on Dorzolamide for glaucoma.    Medical Decision Making:  Established Problem, Stable/Improving (1), Review of Psycho-Social Stressors (1), Review of Last Therapy Session (1) and Review of Medication Regimen & Side Effects (2)   COBOS, FERNANDO,  MD 01/17/2015, 8:27 AM

## 2015-01-17 NOTE — Plan of Care (Signed)
Problem: Alteration in mood & ability to function due to Goal: STG: Patient verbalizes decreases in signs of withdrawal Outcome: Progressing Patient verbalizes decreased cravings for ETOH

## 2015-01-17 NOTE — Plan of Care (Signed)
Problem: Alteration in mood & ability to function due to Goal: STG: Patient verbalizes decreases in signs of withdrawal Outcome: Progressing Pt denies any signs/symptoms of withdrawal

## 2015-01-18 LAB — GLUCOSE, CAPILLARY: GLUCOSE-CAPILLARY: 108 mg/dL — AB (ref 65–99)

## 2015-01-18 MED ORDER — ACAMPROSATE CALCIUM 333 MG PO TBEC: 666.0000 mg | DELAYED_RELEASE_TABLET | Freq: Three times a day (TID) | ORAL | Status: DC

## 2015-01-18 MED ORDER — ESCITALOPRAM OXALATE 10 MG PO TABS: 10.0000 mg | ORAL_TABLET | Freq: Every day | ORAL | Status: DC

## 2015-01-18 MED ORDER — AMLODIPINE BESYLATE 10 MG PO TABS
10.0000 mg | ORAL_TABLET | Freq: Every day | ORAL | Status: AC
Start: 2015-01-18 — End: ?

## 2015-01-18 MED ORDER — CLONIDINE HCL 0.1 MG PO TABS: 0.1000 mg | ORAL_TABLET | Freq: Every day | ORAL | Status: DC

## 2015-01-18 MED ORDER — DORZOLAMIDE HCL 2 % OP SOLN: 1.0000 [drp] | Freq: Three times a day (TID) | OPHTHALMIC | Status: AC

## 2015-01-18 NOTE — [facility] Suicide Risk Assessment (Signed)
[facility] Discharge Suicide Risk Assessment   Demographic Factors:  64 year old male   Total Time spent with patient: 30 minutes  Musculoskeletal: Strength & Muscle Tone: within normal limits Gait & Station: normal Patient leans: Backward  Psychiatric Specialty Exam: Physical Exam  ROS  Blood pressure 127/93, pulse 78, temperature 98.6 F (37 C), temperature source Oral, resp. rate 18, height  (1.753 m), weight 170 lb (77.111 kg).Body mass index is 25.09 kg/(m^2).  General Appearance: improved grooming  Eye Contact::  Good  Speech:  Normal Rate409  Volume:  Normal  Mood:  improved, denies depression at present   Affect:  fuller in range   Thought Process:  Goal Directed and Linear  Orientation:  Full (Time, Place, and Person)  Thought Content:  no hallucinations, no delusions  Suicidal Thoughts:  No  Homicidal Thoughts:  No  Memory:  recent and remote grossly intact   Judgement:  Other:  improved   Insight:  Present  Psychomotor Activity:  Normal  Concentration:  Good  Recall:  Good  Fund of Knowledge:Good  Language: Good  Akathisia:  Negative  Handed:  Right  AIMS (if indicated):     Assets:  Communication Skills Desire for Improvement Resilience  Sleep:  Number of Hours: 6.5  Cognition: WNL  ADL's:  Improved    Have you used any form of tobacco in the last 30 days? (Cigarettes, Smokeless Tobacco, Cigars, and/or Pipes): No  Has this patient used any form of tobacco in the last 30 days? (Cigarettes, Smokeless Tobacco, Cigars, and/or Pipes) No  Mental Status Per Nursing Assessment::   On Admission:  Suicidal ideation indicated by patient  Current Mental Status by Physician: At this time patient is much improved compared to admission. He is alert, attentive , well related, mood is euthymic, affect is appropriate, no thought disorder, no SI or HI, no psychotic symptoms, future oriented .  Loss Factors: Homelessness, poor support network  Historical  Factors: History of depression, history of alcohol and cocaine dependencies   Risk Reduction Factors:   Positive coping skills or problem solving skills  Continued Clinical Symptoms:  As noted, currently improved. Mood better, affect more reactive, no current psychotic symptoms, no SI or HI, no symptoms of WDL. Denies current medication side effects.  Cognitive Features That Contribute To Risk:  No gross cognitive deficits noted upon discharge. Is alert , attentive, and oriented x 3   Suicide Risk:  Mild:  Suicidal ideation of limited frequency, intensity, duration, and specificity.  There are no identifiable plans, no associated intent, mild dysphoria and related symptoms, good self-control (both objective and subjective assessment), few other risk factors, and identifiable protective factors, including available and accessible social support.  Principal Problem: MDD (major depressive disorder), recurrent severe, without psychosis Discharge Diagnoses:  Patient Active Problem List   Diagnosis Date Noted  . Alcohol dependence with uncomplicated withdrawal [F10.230]   . MDD (major depressive disorder), recurrent severe, without psychosis [F33.2] 01/12/2015  . Cocaine dependence without complication [F14.20] 01/12/2015  . Alcohol dependence [F10.20] 01/12/2015    Follow-up Information    Follow up with [facility].   Why:  Please follow-up daily with Shayla at [facility] (number listed below) to check on bed availability.   Contact information:   [address] [place], [place] [address] Phone: [phone]      Plan Of Care/Follow-up recommendations:  Activity:  as tolerated  Diet:  Regular  Tests:  NA Other:  See below   Is  patient on multiple antipsychotic therapies at discharge:  No   Has Patient had three or more failed trials of antipsychotic monotherapy by history:  No  Recommended Plan for Multiple Antipsychotic Therapies: NA  Patient is leaving in good spirits. He is  going to be living at [facility], while he waits for a bed to become available at [facility]. Plans to follow up at [facility] [place]. States he has upcoming appt with PCP for medical follow up/management as needed .  Encouraged to attend AA regularly.    COBOS, FERNANDO 01/18/2015, 10:28 AM

## 2015-01-18 NOTE — Progress Notes (Signed)
  [facility] Adult Case Management Discharge Plan :  Will you be returning to the same living situation after discharge:  Yes,  Pt returning to [facility] At discharge, do you have transportation home?: Yes,  Pt provided with bus pass Do you have the ability to pay for your medications: Yes,  Pt provided with samples and prescriptions  Release of information consent forms completed and in the chart;  Patient's signature needed at discharge.  Patient to Follow up at: Follow-up Information    Follow up with [facility].   Why:  Please follow-up daily with Shayla at [facility] (number listed below) to check on bed availability.   Contact information:   [address] [place], [place] [address] Phone: [phone]      Patient denies SI/HI: Yes,  Pt denies    Safety Planning and Suicide Prevention discussed: Yes,  with Pt; declined family contact  Have you used any form of tobacco in the last 30 days? (Cigarettes, Smokeless Tobacco, Cigars, and/or Pipes): No  Has patient been referred to the Quitline?: N/A patient is not a smoker  Elaina Hoops 01/18/2015, 9:38 AM

## 2015-01-18 NOTE — Progress Notes (Signed)
Patient packed and ready for discharged. Had originally stated he would have someone pick him up but now states he would like to go via bus. Teaching completed, Rx's given along with samples. All belongings returned. Patient verbalized understanding. He denies SI/HI and assures this writer should that change he will seek assistance. Patient discharged in stable condition with bus pass. Lawrence Marseilles

## 2015-01-18 NOTE — Discharge Summary (Signed)
Physician Discharge Summary Note  Patient:  Tyler Farrell is an 64 y.o., male MRN:  [id number] DOB:  07/06/1950 Patient phone:  [phone] (home)  Patient address:   [address] [place] [address],  Total Time spent with patient: 45 minutes  Date of Admission:  01/11/2015 Date of Discharge: 01/18/2015  Reason for Admission:   Upon arrival to ED, Tyler Farrell is an 64 y.o. male who voluntarily presents to [facility] for depression and SI w/ a plan to "walk in front of a car". Pt reports that he doesn't have any medication and was last hospitalized in March, 2016 in [place], [place]. Pt admits that once he was released from the hospital, he did not follow up w/ OP referrals. He indicates that he "need to get on medication and stay on it". Patient is currently living in a shelter, but indicates that he has income from [place] and children [place] [facility] [place] and/or [place]   Pt indicates that he has a hx of alcohol and cocaine abuse and last used both on yesterday morning. In regards to various withdrawal symptoms, he answered "sometimes" or "a couple of times" to almost all of them however; he did not appear to be having any type of current withdrawal symptoms. SA BAL was <5 and UDS tested positive for cocaine.   Pt disclosed having 2 previous suicide attempts of standing on the railing of a bridge. Pt also disclosed having AH of a voice calling his name. Pt was oriented x 4. His speech was logical and coherent. His thought process was normal and focused.   On 01/12/15, pt seen and chart reviewed for initial admission assessment. Pt is calm, cooperative, alert/oriented x4, and appropriate. Pt reports he is still suicidal but denies any plan at this time. Denies homicidal ideation and psychosis and does not appear to be responding to internal stimuli. Pt does report hearing his mother's voice calling his name sometimes but that this does not interfere with his daily life and  that it is chronic. Pt reports serious alcohol abuse but from an intermittent standpoint with binging; pt cannot quantify amount. No tremor, diaphoresis, sever anxiety, headache, or other obvious withdrawal symptoms but we will continue CIWA protocol with librium PRN for now. Pt does report severe anxiety and depression which he feels arise from multiple life stressors primarily including his housing situation (in a shelter, but wants his own place) and financial (although he does have some income with SSI). Denies any family support systems that are significant enough to provide emotional/physical/financial support. Pt receptive to antidepressant and medication management.   Principal Problem: MDD (major depressive disorder), recurrent severe, without psychosis Discharge Diagnoses: Patient Active Problem List   Diagnosis Date Noted  . Alcohol dependence with uncomplicated withdrawal [F10.230]     Priority: High  . MDD (major depressive disorder), recurrent severe, without psychosis [F33.2] 01/12/2015    Priority: High  . Cocaine dependence without complication [F14.20] 01/12/2015    Priority: High  . Alcohol dependence [F10.20] 01/12/2015    Priority: High    Musculoskeletal: Strength & Muscle Tone: within normal limits Gait & Station: normal Patient leans: N/A  Psychiatric Specialty Exam: Physical Exam  Review of Systems  Psychiatric/Behavioral: Positive for depression and substance abuse. Negative for suicidal ideas and hallucinations. The patient is nervous/anxious and has insomnia.   All other systems reviewed and are negative.   Blood pressure 127/93, pulse 78, temperature 98.6 F (37 C), temperature source  Oral, resp. rate 18, height 5\' 9"  (1.753 m), weight 77.111 kg (170 lb).Body mass index is 25.09 kg/(m^2).    SEE MD PSE within the SRA  Have you used any form of tobacco in the last 30 days? (Cigarettes, Smokeless Tobacco, Cigars, and/or Pipes): No  Has this patient used any  form of tobacco in the last 30 days? (Cigarettes, Smokeless Tobacco, Cigars, and/or Pipes) No  Past Medical History:  Past Medical History  Diagnosis Date  . Hypertension   . Hep C w/o coma, chronic   . Glaucoma    History reviewed. No pertinent past surgical history. Family History: History reviewed. No pertinent family history. Social History:  History  Alcohol Use  . Yes    Comment: occ     History  Drug Use  . Yes  . Special: Marijuana, Cocaine    Social History   Social History  . Marital Status: Divorced    Spouse Name: N/A  . Number of Children: N/A  . Years of Education: N/A   Social History Main Topics  . Smoking status: Never Smoker   . Smokeless tobacco: None  . Alcohol Use: Yes     Comment: occ  . Drug Use: Yes    Special: Marijuana, Cocaine  . Sexual Activity: Not Asked   Other Topics Concern  . None   Social History Narrative    Past Psychiatric History: Hospitalizations:  Outpatient Care:  Substance Abuse Care:  Self-Mutilation:  Suicidal Attempts:  Violent Behaviors:   Risk to Self: Is patient at risk for suicide?: Yes What has been your use of drugs/alcohol within the last 12 months?: Daily use of 5 16 oz beers, bottle of wine, uses 1 gram cocaine 2 - 3 times/week, smokes one blunt/weekly; last use of cocaine was 01/10/15 Risk to Others:   Prior Inpatient Therapy:   Prior Outpatient Therapy:    Level of Care:  OP  Hospital Course:   Dereck Agerton was admitted for MDD (major depressive disorder), recurrent severe, without psychosis, and crisis management.  Pt was treated discharged with the medications listed below under Medication List.  Medical problems were identified and treated as needed.  Home medications were restarted as appropriate.  Improvement was monitored by observation and Krystal Eaton 's daily report of symptom reduction.  Emotional and mental status was monitored by daily self-inventory reports completed by Krystal Eaton and  clinical staff.         Krystal Eaton was evaluated by the treatment team for stability and plans for continued recovery upon discharge. Krystal Eaton 's motivation was an integral factor for scheduling further treatment. Employment, transportation, bed availability, health status, family support, and any pending legal issues were also considered during hospital stay. Pt was offered further treatment options upon discharge including but not limited to Residential, Intensive Outpatient, and Outpatient treatment.  Krystal Eaton will follow up with the services as listed below under Follow Up Information.     Upon completion of this admission the patient was both mentally and medically stable for discharge denying suicidal/homicidal ideation, auditory/visual/tactile hallucinations, delusional thoughts and paranoia.     Consults:  None  Significant Diagnostic Studies:  AST 42 (H, asymptomatic), UDS + for cocaine  Discharge Vitals:   Blood pressure 127/93, pulse 78, temperature 98.6 F (37 C), temperature source Oral, resp. rate 18, height 5\' 9"  (1.753 m), weight 77.111 kg (170 lb). Body mass index is 25.09 kg/(m^2). Lab Results:   Results for orders placed or performed  during the hospital encounter of 01/11/15 (from the past 72 hour(s))  Glucose, capillary     Status: None   Collection Time: 01/15/15 11:48 AM  Result Value Ref Range   Glucose-Capillary 87 65 - 99 mg/dL  Glucose, capillary     Status: None   Collection Time: 01/15/15  4:55 PM  Result Value Ref Range   Glucose-Capillary 84 65 - 99 mg/dL  Glucose, capillary     Status: None   Collection Time: 01/16/15  7:29 AM  Result Value Ref Range   Glucose-Capillary 95 65 - 99 mg/dL   Comment 1 Document in Chart   Glucose, capillary     Status: Abnormal   Collection Time: 01/16/15  5:15 PM  Result Value Ref Range   Glucose-Capillary 124 (H) 65 - 99 mg/dL   Comment 1 Document in Chart   Glucose, capillary     Status: Abnormal    Collection Time: 01/17/15  7:53 AM  Result Value Ref Range   Glucose-Capillary 134 (H) 65 - 99 mg/dL   Comment 1 Document in Chart   Glucose, capillary     Status: Abnormal   Collection Time: 01/17/15  4:59 PM  Result Value Ref Range   Glucose-Capillary 115 (H) 65 - 99 mg/dL  Glucose, capillary     Status: Abnormal   Collection Time: 01/18/15  7:21 AM  Result Value Ref Range   Glucose-Capillary 108 (H) 65 - 99 mg/dL    Physical Findings: AIMS: Facial and Oral Movements Muscles of Facial Expression: None, normal Lips and Perioral Area: None, normal Jaw: None, normal Tongue: None, normal,Extremity Movements Upper (arms, wrists, hands, fingers): None, normal Lower (legs, knees, ankles, toes): None, normal, Trunk Movements Neck, shoulders, hips: None, normal, Overall Severity Severity of abnormal movements (highest score from questions above): None, normal Incapacitation due to abnormal movements: None, normal Patient's awareness of abnormal movements (rate only patient's report): No Awareness, Dental Status Current problems with teeth and/or dentures?: No Does patient usually wear dentures?: No  CIWA:  CIWA-Ar Total: 0 COWS:      See Psychiatric Specialty Exam and Suicide Risk Assessment completed by Attending Physician prior to discharge.  Discharge destination:  Home  Is patient on multiple antipsychotic therapies at discharge:  No   Has Patient had three or more failed trials of antipsychotic monotherapy by history:  No    Recommended Plan for Multiple Antipsychotic Therapies: NA     Medication List    TAKE these medications      Indication   acamprosate 333 MG tablet  Commonly known as:  CAMPRAL  Take 2 tablets (666 mg total) by mouth 3 (three) times daily with meals.   Indication:  Excessive Use of Alcohol     amLODipine 10 MG tablet  Commonly known as:  NORVASC  Take 1 tablet (10 mg total) by mouth daily.   Indication:  High Blood Pressure     cloNIDine  0.1 MG tablet  Commonly known as:  CATAPRES  Take 1 tablet (0.1 mg total) by mouth daily.   Indication:  High Blood Pressure     dorzolamide 2 % ophthalmic solution  Commonly known as:  TRUSOPT  Place 1 drop into both eyes 3 (three) times daily.   Indication:  ocupar management     escitalopram 10 MG tablet  Commonly known as:  LEXAPRO  Take 1 tablet (10 mg total) by mouth daily.   Indication:  depression and anxiety  Follow-up Information    Follow up with [facility].   Why:  Please follow-up daily with Shayla at [facility] (number listed below) to check on bed availability.   Contact information:   [address] [place], [place] [address] Phone: [phone]      Follow-up recommendations:  Activity:  As tolerated Diet:  heart healthy with low sodium.  Comments:   Take all medications as prescribed. Keep all follow-up appointments as scheduled.  Do not consume alcohol or use illegal drugs while on prescription medications. Report any adverse effects from your medications to your primary care provider promptly.  In the event of recurrent symptoms or worsening symptoms, call 911, a crisis hotline, or go to the nearest emergency department for evaluation.   Total Discharge Time: Greater than 30 minutes  Signed: Beau Fanny, FNP-BC 01/18/2015, 11:29 AM  I personally assessed the patient and formulated the plan Madie Reno A. Dub Mikes, M.D.

## 2015-01-18 NOTE — BHH Group Notes (Signed)
[facility] LCSW Aftercare Discharge Planning Group Note  01/18/2015 8:45 AM  Participation Quality: Alert, Appropriate and Oriented  Mood/Affect: Flat  Depression Rating: 3  Anxiety Rating: 3  Thoughts of Suicide: Pt denies SI/HI  Will you contract for safety? Yes  Current AVH: Pt denies  Plan for Discharge/Comments: Pt attended discharge planning group and actively participated in group. CSW discussed suicide prevention education with the group and encouraged them to discuss discharge planning and any relevant barriers. Pt reports that he is feeling "okay" and is concerned about his discharge plan. Currently planning to return to [facility] while waiting for a bed at [facility].  Transportation Means: Pt reports access to transportation  Supports: No supports mentioned at this time  Chad Cordial, LCSWA 01/18/2015 9:23 AM

## 2015-01-18 NOTE — Progress Notes (Signed)
Patient up and more visible in the milieu today. Affect appropriate, mood pleasant. Rating depression, hopelessness and anxiety at a 5/10. Complaining of lower back pain (chronic) and asks for prn medication. Patient medicated per orders. Given tylenol prn. Patient given support and praised for progress. Awaiting discharge orders, meds, etc. He denies SI/HI and remains safe. Lawrence Marseilles

## 2015-01-18 NOTE — Progress Notes (Signed)
Recreation Therapy Notes  Date: 08.10.16 Time: 930 am Location: [address] Hall Group Room  Group Topic: Stress Management  Goal Area(s) Addresses:  Patient will verbalize importance of using healthy stress management.  Patient will identify positive emotions associated with healthy stress management.   Intervention: Stress Management  Activity : Progressive Muscle Relaxation. LRT will introduce and instruct patients on the stress management technique of progressive muscle relaxation. Patients were asked to follow Farrell long with Farrell script read Farrell loud by LRT to participate in the stress management technique of progressive muscle relaxation.  Education: Stress Management, Discharge Planning.   Education Outcome: Acknowledges edcuation/In group clarification offered/Needs additional education  Clinical Observations/Feedback: Patient did not attend group.    Tyler Farrell, LRT/CTRS         Tyler Farrell 01/18/2015 3:58 PM 

## 2015-02-01 ENCOUNTER — Encounter (HOSPITAL_COMMUNITY): Payer: Self-pay | Admitting: Physical Medicine and Rehabilitation

## 2015-02-01 ENCOUNTER — Emergency Department (HOSPITAL_COMMUNITY): Payer: Medicare Other

## 2015-02-01 ENCOUNTER — Emergency Department (HOSPITAL_COMMUNITY)
Admission: EM | Admit: 2015-02-01 | Discharge: 2015-02-01 | Disposition: A | Discharge status: Home / Self Care | Payer: Medicare Other | Attending: Emergency Medicine | Admitting: Emergency Medicine

## 2015-02-01 DIAGNOSIS — E876 Hypokalemia: Secondary | ICD-10-CM | POA: Diagnosis not present

## 2015-02-01 DIAGNOSIS — Z880 Allergy status to penicillin: Secondary | ICD-10-CM | POA: Diagnosis not present

## 2015-02-01 DIAGNOSIS — R42 Dizziness and giddiness: Secondary | ICD-10-CM

## 2015-02-01 DIAGNOSIS — R55 Syncope and collapse: Secondary | ICD-10-CM | POA: Diagnosis present

## 2015-02-01 DIAGNOSIS — I10 Essential (primary) hypertension: Secondary | ICD-10-CM | POA: Insufficient documentation

## 2015-02-01 LAB — COMPREHENSIVE METABOLIC PANEL
ALBUMIN: 3.9 g/dL (ref 3.5–5.0)
ALK PHOS: 70 U/L (ref 38–126)
ALT: 33 U/L (ref 17–63)
ANION GAP: 8 (ref 5–15)
AST: 29 U/L (ref 15–41)
BUN: 10 mg/dL (ref 6–20)
CALCIUM: 9.2 mg/dL (ref 8.9–10.3)
CO2: 27 mmol/L (ref 22–32)
Chloride: 104 mmol/L (ref 101–111)
Creatinine, Ser: 1.21 mg/dL (ref 0.61–1.24)
GFR calc non Af Amer: >60 mL/min (ref >60)
GLUCOSE: 114 mg/dL — AB (ref 65–99)
POTASSIUM: 3.2 mmol/L — AB (ref 3.5–5.1)
SODIUM: 139 mmol/L (ref 135–145)
Total Bilirubin: 0.6 mg/dL (ref 0.3–1.2)
Total Protein: 7.5 g/dL (ref 6.5–8.1)

## 2015-02-01 LAB — CBC WITH DIFFERENTIAL/PLATELET
BASOS PCT: 0 % (ref 0–1)
Basophils Absolute: 0 10*3/uL (ref 0.0–0.1)
EOS PCT: 2 % (ref 0–5)
Eosinophils Absolute: 0.2 10*3/uL (ref 0.0–0.7)
HCT: 43.4 % (ref 39.0–52.0)
Hemoglobin: 15 g/dL (ref 13.0–17.0)
LYMPHS ABS: 2.9 10*3/uL (ref 0.7–4.0)
Lymphocytes Relative: 31 % (ref 12–46)
MCH: 33 pg (ref 26.0–34.0)
MCHC: 34.6 g/dL (ref 30.0–36.0)
MCV: 95.4 fL (ref 78.0–100.0)
MONO ABS: 0.6 10*3/uL (ref 0.1–1.0)
MONOS PCT: 7 % (ref 3–12)
NEUTROS PCT: 60 % (ref 43–77)
Neutro Abs: 5.6 10*3/uL (ref 1.7–7.7)
PLATELETS: 231 10*3/uL (ref 150–400)
RBC: 4.55 MIL/uL (ref 4.22–5.81)
RDW: 13.7 % (ref 11.5–15.5)
WBC: 9.3 10*3/uL (ref 4.0–10.5)

## 2015-02-01 LAB — TROPONIN I: Troponin I: <0.03 ng/mL (ref <0.031)

## 2015-02-01 MED ORDER — MECLIZINE HCL 25 MG PO TABS: 25.0000 mg | ORAL_TABLET | Freq: Three times a day (TID) | ORAL | Status: AC | PRN

## 2015-02-01 MED ORDER — POTASSIUM CHLORIDE CRYS ER 20 MEQ PO TBCR
40.0000 meq | EXTENDED_RELEASE_TABLET | Freq: Once | ORAL | Status: AC
  Administered 2015-02-01: 40 meq via ORAL
  Filled 2015-02-01: qty 2

## 2015-02-01 NOTE — Discharge Instructions (Signed)
Vertigo Vertigo means you feel like you are moving when you are not. Vertigo can make you feel like things around you are moving when they are not. This problem often goes away on its own.  HOME CARE   Follow your doctor's instructions.  Avoid driving.  Avoid using heavy machinery.  Avoid doing any activity that could be dangerous if you have a vertigo attack.  Tell your doctor if a medicine seems to cause your vertigo. GET HELP RIGHT AWAY IF:   Your medicines do not help or make you feel worse.  You have trouble talking or walking.  You feel weak or have trouble using your arms, hands, or legs.  You have bad headaches.  You keep feeling sick to your stomach (nauseous) or throwing up (vomiting).  Your vision changes.  A family member notices changes in your behavior.  Your problems get worse. MAKE SURE YOU:  Understand these instructions.  Will watch your condition.  Will get help right away if you are not doing well or get worse. Document Released: 03/05/2008 Document Revised: 08/19/2011 Document Reviewed: 12/13/2010 ExitCare Patient Information 2015 [employer]. This information is not intended to replace advice given to you by your health care provider. Make sure you discuss any questions you have with your health care provider.  

## 2015-02-01 NOTE — ED Provider Notes (Signed)
CSN: [id number]     Arrival date & time 02/01/15  1616 History   First MD Initiated Contact with Patient 02/01/15 1806     Chief Complaint  Patient presents with  . Near Syncope     (Consider location/radiation/quality/duration/timing/severity/associated sxs/prior Treatment) HPI Comments: Patient presents for evaluation of feeling "woozy" for several weeks. Today, however, his symptoms worsened. Patient reports that he has been feeling very dizzy and has had a sense of spinning when he stands up. He has not had any associated headache or vision change. There is no neck pain or stiffness. He has not had any fever. Patient denies associated chest pain, heart palpitations, shortness of breath. He has not had any recent illness. Patient does not have any symptoms lying in the bed currently, does report that he has noticed the symptoms mostly with standing and position changes.  Patient is a 64 y.o. male presenting with near-syncope.  Near Syncope Pertinent negatives include no chest pain, no headaches and no shortness of breath.    Past Medical History  Diagnosis Date  . Hypertension    History reviewed. No pertinent past surgical history. No family history on file. Social History  Substance Use Topics  . Smoking status: Never Smoker   . Smokeless tobacco: None  . Alcohol Use: No    Review of Systems  Respiratory: Negative for shortness of breath.   Cardiovascular: Positive for near-syncope. Negative for chest pain.  Neurological: Positive for dizziness. Negative for headaches.  All other systems reviewed and are negative.     Allergies  Penicillins  Home Medications   Prior to Admission medications   Not on File   BP 149/96 mmHg  Pulse 64  Temp(Src) 98.6 F (37 C) (Oral)  Resp 16  SpO2 100% Physical Exam  Constitutional: He is oriented to person, place, and time. He appears well-developed and well-nourished. No distress.  HENT:  Head: Normocephalic and atraumatic.   Right Ear: Hearing normal.  Left Ear: Hearing normal.  Nose: Nose normal.  Mouth/Throat: Oropharynx is clear and moist and mucous membranes are normal.  Eyes: Conjunctivae and EOM are normal. Pupils are equal, round, and reactive to light.  Neck: Normal range of motion. Neck supple.  Cardiovascular: Regular rhythm, S1 normal and S2 normal.  Exam reveals no gallop and no friction rub.   No murmur heard. Pulmonary/Chest: Effort normal and breath sounds normal. No respiratory distress. He exhibits no tenderness.  Abdominal: Soft. Normal appearance and bowel sounds are normal. There is no hepatosplenomegaly. There is no tenderness. There is no rebound, no guarding, no tenderness at McBurney's point and negative Murphy's sign. No hernia.  Musculoskeletal: Normal range of motion.  Neurological: He is alert and oriented to person, place, and time. He has normal strength. No cranial nerve deficit or sensory deficit. Coordination normal. GCS eye subscore is 4. GCS verbal subscore is 5. GCS motor subscore is 6.  Extraocular muscle movement: normal No visual field cut Pupils: equal and reactive both direct and consensual response is normal Left lateral nystagmus present, fatiguable    Sensory function is intact to light touch, pinprick Proprioception intact  Grip strength 5/5 symmetric in upper extremities No pronator drift Normal finger to nose bilaterally  Lower extremity strength 5/5 against gravity Normal heel to shin bilaterally    Skin: Skin is warm, dry and intact. No rash noted. No cyanosis.  Psychiatric: He has a normal mood and affect. His speech is normal and behavior is normal. Thought content normal.  Nursing note and vitals reviewed.   ED Course  Procedures (including critical care time) Labs Review Labs Reviewed  COMPREHENSIVE METABOLIC PANEL - Abnormal; Notable for the following:    Potassium 3.2 (*)    Glucose, Bld 114 (*)    All other components within normal limits   CBC WITH DIFFERENTIAL/PLATELET  TROPONIN I    Imaging Review Dg Chest 2 View  02/01/2015   CLINICAL DATA:  Near syncope, very dizzy especially when standing, history hypertension  EXAM: CHEST  2 VIEW  COMPARISON:  None  FINDINGS: Upper normal heart size.  Mild tortuosity of thoracic aorta.  Elevation of LEFT diaphragm with LEFT basilar atelectasis.  Remaining lungs clear.  No pleural effusion or pneumothorax.  Minimal thoracolumbar scoliosis.  IMPRESSION: Significant elevation of LEFT diaphragm with LEFT basilar atelectasis.   Electronically Signed   By: Ulyses Southward M.D.   On: 02/01/2015 18:58   Ct Head Wo Contrast  02/01/2015   CLINICAL DATA:  Pt reports near syncope, states he feels very dizzy, especially with standing. No neurological deficits noted. Pt is alert and oriented x4  EXAM: CT HEAD WITHOUT CONTRAST  TECHNIQUE: Contiguous axial images were obtained from the base of the skull through the vertex without intravenous contrast.  COMPARISON:  None.  FINDINGS: No acute intracranial hemorrhage. No focal mass lesion. No CT evidence of acute infarction. No midline shift or mass effect. No hydrocephalus. Basilar cisterns are patent.  Mild periventricular white matter hypodensities.  Paranasal sinuses and  mastoid air cells are clear.  IMPRESSION: 1. No acute intracranial findings.  No trauma. 2. Mild white matter microvascular disease.   Electronically Signed   By: Genevive Bi M.D.   On: 02/01/2015 18:54   I have personally reviewed and evaluated these images and lab results as part of my medical decision-making.   EKG Interpretation None      MDM   Final diagnoses:  None   generalized weakness  Vertigo  Patient presents to the ER for evaluation of symptoms of dizziness and vertigo type symptoms that have been ongoing for several days. Patient has symptoms mainly with standing and changing positions. Examination does not reveal any cerebellar abnormality, has a normal neurologic  exam other than mild nystagmus which is fatigable. CT head was unremarkable. Remainder of workup was normal. Patient is not orthostatic here in the ER. This does not appear consistent with central vertigo, will treat with meclizine, follow-up with PCP. Return if symptoms worsen.    Gilda Crease, MD 02/01/15 [id number]

## 2015-02-01 NOTE — ED Notes (Addendum)
Pt reports near syncope, states he feels very dizzy, especially with standing. No neurological deficits noted. Pt is alert and oriented x4.

## 2015-02-02 ENCOUNTER — Encounter (HOSPITAL_COMMUNITY): Payer: Self-pay | Admitting: Registered Nurse

## 2015-04-22 ENCOUNTER — Encounter (HOSPITAL_COMMUNITY): Payer: Self-pay | Admitting: Emergency Medicine

## 2015-04-22 ENCOUNTER — Emergency Department (HOSPITAL_COMMUNITY): Payer: Medicare Other

## 2015-04-22 ENCOUNTER — Emergency Department (HOSPITAL_COMMUNITY)
Admission: EM | Admit: 2015-04-22 | Discharge: 2015-04-23 | Disposition: A | Discharge status: Home / Self Care | Payer: Medicare Other | Attending: Emergency Medicine | Admitting: Emergency Medicine

## 2015-04-22 DIAGNOSIS — F1410 Cocaine abuse, uncomplicated: Secondary | ICD-10-CM | POA: Insufficient documentation

## 2015-04-22 DIAGNOSIS — R45851 Suicidal ideations: Secondary | ICD-10-CM

## 2015-04-22 DIAGNOSIS — F329 Major depressive disorder, single episode, unspecified: Secondary | ICD-10-CM | POA: Diagnosis not present

## 2015-04-22 DIAGNOSIS — H409 Unspecified glaucoma: Secondary | ICD-10-CM | POA: Insufficient documentation

## 2015-04-22 DIAGNOSIS — Z8619 Personal history of other infectious and parasitic diseases: Secondary | ICD-10-CM | POA: Insufficient documentation

## 2015-04-22 DIAGNOSIS — Z880 Allergy status to penicillin: Secondary | ICD-10-CM | POA: Insufficient documentation

## 2015-04-22 DIAGNOSIS — I10 Essential (primary) hypertension: Secondary | ICD-10-CM | POA: Insufficient documentation

## 2015-04-22 DIAGNOSIS — Z79899 Other long term (current) drug therapy: Secondary | ICD-10-CM | POA: Insufficient documentation

## 2015-04-22 DIAGNOSIS — F10230 Alcohol dependence with withdrawal, uncomplicated: Secondary | ICD-10-CM | POA: Diagnosis not present

## 2015-04-22 LAB — RAPID URINE DRUG SCREEN, HOSP PERFORMED
Amphetamines: NOT DETECTED
BARBITURATES: NOT DETECTED
Benzodiazepines: NOT DETECTED
Cocaine: POSITIVE — AB
Opiates: NOT DETECTED
TETRAHYDROCANNABINOL: NOT DETECTED

## 2015-04-22 LAB — BASIC METABOLIC PANEL
Anion gap: 8 (ref 5–15)
BUN: 12 mg/dL (ref 6–20)
CHLORIDE: 108 mmol/L (ref 101–111)
CO2: 24 mmol/L (ref 22–32)
CREATININE: 1.02 mg/dL (ref 0.61–1.24)
Calcium: 9.1 mg/dL (ref 8.9–10.3)
GFR calc Af Amer: >60 mL/min (ref >60)
Glucose, Bld: 95 mg/dL (ref 65–99)
POTASSIUM: 3.3 mmol/L — AB (ref 3.5–5.1)
Sodium: 140 mmol/L (ref 135–145)

## 2015-04-22 LAB — CBC WITH DIFFERENTIAL/PLATELET
Basophils Absolute: 0 10*3/uL (ref 0.0–0.1)
Basophils Relative: 1 %
Eosinophils Absolute: 0.1 10*3/uL (ref 0.0–0.7)
Eosinophils Relative: 2 %
HCT: 40.1 % (ref 39.0–52.0)
Hemoglobin: 13.6 g/dL (ref 13.0–17.0)
LYMPHS PCT: 28 %
Lymphs Abs: 1.5 10*3/uL (ref 0.7–4.0)
MCH: 32.7 pg (ref 26.0–34.0)
MCHC: 33.9 g/dL (ref 30.0–36.0)
MCV: 96.4 fL (ref 78.0–100.0)
MONO ABS: 0.5 10*3/uL (ref 0.1–1.0)
Monocytes Relative: 9 %
Neutro Abs: 3.3 10*3/uL (ref 1.7–7.7)
Neutrophils Relative %: 60 %
PLATELETS: 236 10*3/uL (ref 150–400)
RBC: 4.16 MIL/uL — ABNORMAL LOW (ref 4.22–5.81)
RDW: 13.2 % (ref 11.5–15.5)
WBC: 5.5 10*3/uL (ref 4.0–10.5)

## 2015-04-22 LAB — ETHANOL

## 2015-04-22 LAB — I-STAT TROPONIN, ED: Troponin i, poc: 0.01 ng/mL (ref 0.00–0.08)

## 2015-04-22 MED ORDER — POTASSIUM CHLORIDE CRYS ER 20 MEQ PO TBCR
20.0000 meq | EXTENDED_RELEASE_TABLET | Freq: Once | ORAL | Status: AC
  Administered 2015-04-22: 20 meq via ORAL
  Filled 2015-04-22: qty 1

## 2015-04-22 MED ORDER — AMLODIPINE BESYLATE 5 MG PO TABS
10.0000 mg | ORAL_TABLET | Freq: Every day | ORAL | Status: DC
  Administered 2015-04-22 – 2015-04-23 (×2): 10 mg via ORAL
  Filled 2015-04-22 (×2): qty 2

## 2015-04-22 MED ORDER — DORZOLAMIDE HCL 2 % OP SOLN
1.0000 [drp] | Freq: Three times a day (TID) | OPHTHALMIC | Status: DC
  Administered 2015-04-22 – 2015-04-23 (×3): 1 [drp] via OPHTHALMIC
  Filled 2015-04-22 (×2): qty 10

## 2015-04-22 NOTE — ED Provider Notes (Signed)
CSN: [id number]     Arrival date & time 04/22/15  0707 History   First MD Initiated Contact with Patient 04/22/15 0708     Chief Complaint  Patient presents with  . Suicidal     The history is provided by the patient. No language interpreter was used.   Tyler Farrell is a 64 year old gentleman with history of psychiatric disorder and hypertension that presents for suicidal ideation. He has been out of his psychiatric medicines since august and his  blood pressure medicines for the last week. He endorses ongoing suicidal ideation without any discrete plan. He's having occasional auditory hallucinations, last hallucination was 2 hours ago where he heard somebody calling his name. He lives at home alone. He uses cocaine powder occasionally, last use 3 weeks ago, he drinks alcohol regularly 4-5 beers daily, last used 2 days ago. He reports 1 week of cough, occasional chest pain with coughing described as discomfort. No fevers, vomiting, abdominal pain. He sought help today for his feelings of suicidal ideation.  Past Medical History  Diagnosis Date  . Hep C w/o coma, chronic (HCC)   . Glaucoma   . Hypertension    History reviewed. No pertinent past surgical history. History reviewed. No pertinent family history. Social History  Substance Use Topics  . Smoking status: Never Smoker   . Smokeless tobacco: None  . Alcohol Use: Yes     Comment: occ    Review of Systems  All other systems reviewed and are negative.     Allergies  Penicillins and Penicillins  Home Medications   Prior to Admission medications   Medication Sig Start Date End Date Taking? Authorizing Provider  acamprosate (CAMPRAL) 333 MG tablet Take 2 tablets (666 mg total) by mouth 3 (three) times daily with meals. 01/18/15   Beau Fanny, FNP  amLODipine (NORVASC) 10 MG tablet Take 1 tablet (10 mg total) by mouth daily. 01/18/15   Beau Fanny, FNP  cloNIDine (CATAPRES) 0.1 MG tablet Take 1 tablet (0.1 mg total) by  mouth daily. 01/18/15   Beau Fanny, FNP  dorzolamide (TRUSOPT) 2 % ophthalmic solution Place 1 drop into both eyes 3 (three) times daily. 01/18/15   Beau Fanny, FNP  escitalopram (LEXAPRO) 10 MG tablet Take 1 tablet (10 mg total) by mouth daily. 01/18/15   Beau Fanny, FNP  meclizine (ANTIVERT) 25 MG tablet Take 1 tablet (25 mg total) by mouth 3 (three) times daily as needed for dizziness. 02/01/15   Gilda Crease, MD   BP 153/111 mmHg  Pulse 86  Resp 18  SpO2 97% Physical Exam  Constitutional: He is oriented to person, place, and time. He appears well-developed and well-nourished.  HENT:  Head: Normocephalic and atraumatic.  Cardiovascular: Normal rate and regular rhythm.   No murmur heard. Pulmonary/Chest: Effort normal and breath sounds normal. No respiratory distress.  Abdominal: Soft. There is no tenderness. There is no rebound and no guarding.  Musculoskeletal: He exhibits no edema or tenderness.  Neurological: He is alert and oriented to person, place, and time.  Skin: Skin is warm and dry.  Psychiatric:  Flat affect  Nursing note and vitals reviewed.   ED Course  Procedures (including critical care time) Labs Review Labs Reviewed  BASIC METABOLIC PANEL - Abnormal; Notable for the following:    Potassium 3.3 (*)    All other components within normal limits  CBC WITH DIFFERENTIAL/PLATELET - Abnormal; Notable for the [id number]    RBC 4.16 (*)  All other components within normal limits  ETHANOL  URINE RAPID DRUG SCREEN, HOSP PERFORMED  I-STAT TROPOININ, ED    Imaging Review Dg Chest 2 View  04/22/2015  CLINICAL DATA:  Pt here with c/o suicidal thoughts , pt states that he has not had his meds since summer. Cough and SOB x 2 days. Hx of HTN. EXAM: CHEST  2 VIEW COMPARISON:  02/01/2015 FINDINGS: Cardiac silhouette normal in size and configuration. The aorta is uncoiled. No mediastinal or hilar masses or evidence of adenopathy. Clear lungs.  Elevated left  hemidiaphragm is stable. No pleural effusion or pneumothorax. Bony thorax is intact. IMPRESSION: No active cardiopulmonary disease. Electronically Signed   By: Amie Portlandavid  Ormond M.D.   On: 04/22/2015 07:52   I have personally reviewed and evaluated these images and lab results as part of my medical decision-making.   EKG Interpretation   Date/Time:  Saturday April 22 2015 07:36:38 EST Ventricular Rate:  68 PR Interval:  188 QRS Duration: 91 QT Interval:  413 QTC Calculation: 439 R Axis:   48 Text Interpretation:  Sinus rhythm Nonspecific ST abnormality No  significant change since last tracing Confirmed by Lincoln Brighamees, Liz [phone]([id number]) on  04/22/2015 7:39:17 AM      MDM   Final diagnoses:  Suicidal ideation    Patient with history of psychiatric disorder, possible schizophrenia here with suicidal ideations and auditory hallucinations. He is calm and appropriate on examination. Lungs are clear with no respiratory distress. Checking chest x-ray and EKG given his respiratory symptoms. Checking BMP given history of hypertension and not currently on medications. Plan to consult psychiatry after medical clearance.  [id number] patient is medically cleared for psychiatric evaluation.  Will restart home BP meds.   Tilden FossaElizabeth Saanvika Vazques, MD 04/22/15 [phone]

## 2015-04-22 NOTE — ED Notes (Signed)
Pt ambulated the the nursing station to make a phone call in a steady manner.

## 2015-04-22 NOTE — ED Notes (Signed)
STATES LAST ETOH WAS 3 DAYS AGO AND LAST COCAINE WAS OVER 1 MONTH AGO. PT NOTED W/NO DIRECT EYE CONTACT - KEEPS EYES CLOSED AND HEAD LOOKING FORWARD DURING CONVERSATION. PT NOTED TO BE WEARING HIS GLASSES.

## 2015-04-22 NOTE — ED Notes (Addendum)
Patient was given a drink and snack. Regular diet order taken for lunch.

## 2015-04-22 NOTE — Progress Notes (Addendum)
CSW introduced self and acknowledged the patient. Patient is alert and oriented, however, appeared to be restless. Patient is aware and understanding of CSW role and responsibilities. No CSW needs requested at this time. CSW will continue to follow and provide support while in hospital.   Marsella Suman, LCSWA Clinical Social Worker [facility] Ph: [phone]  

## 2015-04-22 NOTE — ED Notes (Signed)
Pt here with c/o suicidal thoughts , pt states that he has not had his meds since summer

## 2015-04-22 NOTE — BH Assessment (Addendum)
Tele Assessment Note   Tyler Farrell is an 64 y.o. male. Writer spoke w/ EDP Madilyn Hook prior to [employer] re: pt's clinical info. Pt is pleasant and oriented x 4. He endorses SI. Per chart review, pt was inpatient at [facility] Aug 2016 for SI and MDD. Pt reports he has schizophrenia. Pt has also been inpatient in [facility] and [place]. Pt reports brother has schizophrenia. Pt endorses SI. He says he hears "different voices calling my name". He reports depressed mood. He endorses of loss of interest, isolating, guilt, fatigue and irritability. Pt requests inpatient treatment to get back on his psych meds. Pt sts he hasn't gone to outpatient treatment. Pt reports alcohol and cocaine abuse. Pt reports he drinks approx 3 to 4 beers daily (12 oz & 40 oz) and he drank one 40 oz beer 3 days ago. Pt sts he uses powder cocaine when he can afford it, approx $200 to $300. He last used cocaine 3 weeks ago. Pt is unable to contract for safety.  Diagnosis: Schizophrenia                   Alcohol Abuse, Mild  Past Medical History:  Past Medical History  Diagnosis Date  . Hep C w/o coma, chronic (HCC)   . Glaucoma   . Hypertension     History reviewed. No pertinent past surgical history.  Family History: History reviewed. No pertinent family history.  Social History:  reports that he has never smoked. He does not have any smokeless tobacco history on file. He reports that he drinks alcohol. He reports that he uses illicit drugs (Marijuana and Cocaine).  Additional Social History:  Alcohol / Drug Use Pain Medications: pt denies abuse - see PTA meds Prescriptions: pt denies abuse - see PTA meds Over the Counter: pt denies abuse - see PTA meds History of alcohol / drug use?: Yes Substance #1 Name of Substance 1: alcohol 1 - Age of First Use: 16 1 - Amount (size/oz): three to four beers (12 oz or 40 oz) 1 - Frequency: daily 1 - Duration: years 1 - Last Use / Amount: 04/19/15 - one 40 oz  beer Substance #2 Name of Substance 2: cocaine - powder 2 - Age of First Use:  22 2 - Amount (size/oz): varies - depends on what he can afford 2 - Frequency: every few weeks 2 - Duration: months 2 - Last Use / Amount: 3 weeks ago - $200 to $300   CIWA: CIWA-Ar BP: (!) 162/111 mmHg Pulse Rate: 70 COWS:    PATIENT STRENGTHS: (choose at least two) Ability for insight Average or above average intelligence Communication skills General fund of knowledge  Allergies:  Allergies  Allergen Reactions  . Penicillins Other (See Comments)    unknown    Home Medications:  (Not in a hospital admission)  OB/GYN Status:  No LMP for male patient.  General Assessment Data Location of Assessment: [facility] ED TTS Assessment: In system Is this a Tele or Face-to-Face Assessment?: Tele Assessment Is this an Initial Assessment or a Re-assessment for this encounter?: Initial Assessment Marital status: Single Living Arrangements: Alone Can pt return to current living arrangement?: Yes Admission Status: Voluntary Is patient capable of signing voluntary admission?: Yes Referral Source: Self/Family/Friend Insurance type: medicare     Crisis Care Plan Living Arrangements: Alone Name of Psychiatrist: none Name of Therapist: none  Education Status Is patient currently in school?: No Current Grade: 12  Risk to self with the past  6 months Suicidal Ideation: Yes-Currently Present Has patient been a risk to self within the past 6 months prior to admission? : Yes Suicidal Intent: No Has patient had any suicidal intent within the past 6 months prior to admission? : No Is patient at risk for suicide?: Yes Suicidal Plan?: No Has patient had any suicidal plan within the past 6 months prior to admission? : No Access to Means:  (n/a) What has been your use of drugs/alcohol within the last 12 months?: daily alcohol, cocaine every 3 wks Previous Attempts/Gestures: No How many times?:  (pt denies but  earlier assessment pt reported 2 attempts) Other Self Harm Risks: none Intentional Self Injurious Behavior: None Family Suicide History: No (pt's brother has schizophrenia) Recent stressful life event(s): Other (Comment) (depressive sxs, needs psych meds) Persecutory voices/beliefs?: No Depression: Yes Depression Symptoms: Loss of interest in usual pleasures, Feeling angry/irritable, Isolating, Guilt, Fatigue, Despondent Substance abuse history and/or treatment for substance abuse?: Yes Suicide prevention information given to non-admitted patients: Not applicable  Risk to Others within the past 6 months Homicidal Ideation: No Does patient have any lifetime risk of violence toward others beyond the six months prior to admission? : No Thoughts of Harm to Others: No Current Homicidal Intent: No Current Homicidal Plan: No Access to Homicidal Means: No Identified Victim: none History of harm to others?: No Assessment of Violence: None Noted Violent Behavior Description: pt denies hx violence - calm  Does patient have access to weapons?: No Criminal Charges Pending?: No Does patient have a court date: No Is patient on probation?: No  Psychosis Hallucinations: Auditory Delusions: None noted  Mental Status Report Appearance/Hygiene: In scrubs, Unremarkable Eye Contact: Good Motor Activity: Freedom of movement Speech: Logical/coherent Level of Consciousness: Alert Mood: Depressed, Anhedonia, Sad, Sullen, Guilty Affect: Appropriate to circumstance, Depressed, Sad Anxiety Level: Minimal Thought Processes: Relevant, Coherent Judgement: Unimpaired Orientation: Person, Place, Time, Situation Obsessive Compulsive Thoughts/Behaviors: None  Cognitive Functioning Concentration: Normal Memory: Recent Intact, Remote Intact IQ: Average Insight: Good Impulse Control: Fair Appetite: Poor Weight Loss:  (pt unsure of weight loss) Sleep: No Change Vegetative Symptoms: None  ADLScreening  [facility] Assessment Services) Patient's cognitive ability adequate to safely complete daily activities?: Yes Patient able to express need for assistance with ADLs?: Yes Independently performs ADLs?: Yes (appropriate for developmental age)  Prior Inpatient Therapy Prior Inpatient Therapy: Yes Prior Therapy Dates: Aug 2016, 2011, 2010 Prior Therapy [place] [facility] [place] Reason for Treatment: depression, SI, schizophrenia  Prior Outpatient Therapy Prior Outpatient Therapy: No Prior Therapy Dates: na Prior Therapy Facilty/Provider(s): na Reason for Treatment: na Does patient have an ACCT team?: No Does patient have Intensive In-House Services?  : No Does patient have [facility] services? : Unknown Does patient have P4CC services?: Unknown  ADL Screening (condition at time of admission) Patient's cognitive ability adequate to safely complete daily activities?: Yes Is the patient deaf or have difficulty hearing?: No Does the patient have difficulty seeing, even when wearing glasses/contacts?: Yes Does the patient have difficulty concentrating, remembering, or making decisions?: No Patient able to express need for assistance with ADLs?: Yes Does the patient have difficulty dressing or bathing?: No Independently performs ADLs?: Yes (appropriate for developmental age) Does the patient have difficulty walking or climbing stairs?: No Weakness of Legs: None Weakness of Arms/Hands: None  Home Assistive Devices/Equipment Home Assistive Devices/Equipment: Eyeglasses    Abuse/Neglect Assessment (Assessment to be complete while patient is alone) Physical Abuse: Denies Verbal Abuse: Denies Sexual Abuse: Denies Exploitation of  patient/patient's resources: Denies Self-Neglect: Denies     Merchant [employer] officer (For Healthcare) Does patient have an advance directive?: No Would patient like information on creating an advanced directive?: No - patient declined information     Additional Information 1:1 In Past 12 Months?: No CIRT Risk: No Elopement Risk: No Does patient have medical clearance?: No     Disposition:   Discussed pt with May Agustin NP. Per Dominga Ferry NP, keep pt in ED overnight for medical clearance and pt to be re-evaluated by psychiatry in am to determine disposition.  Disposition Initial Assessment Completed for this Encounter: Yes Disposition of Patient: Other dispositions Other disposition(s): Other (Comment) (may agustin NP rec overnight observe for med clearance w/ am)  Karmine Kauer P 04/22/2015 10:42 AM

## 2015-04-23 DIAGNOSIS — R45851 Suicidal ideations: Secondary | ICD-10-CM | POA: Diagnosis not present

## 2015-04-23 DIAGNOSIS — F10230 Alcohol dependence with withdrawal, uncomplicated: Secondary | ICD-10-CM

## 2015-04-23 DIAGNOSIS — F329 Major depressive disorder, single episode, unspecified: Secondary | ICD-10-CM | POA: Diagnosis not present

## 2015-04-23 NOTE — ED Notes (Addendum)
Snack and drink was given to patient, Regular diet order taken for lunch.

## 2015-04-23 NOTE — Consult Note (Signed)
Telepsych Consultation   Reason for Consult:  Suicidal Ideation Referring Physician: EDP Patient Identification: Tyler Farrell MRN:  [id number] Principal Diagnosis: Suicidal ideation Diagnosis:   Patient Active Problem List   Diagnosis Date Noted  . Suicidal ideation [R45.851]   . Alcohol dependence with uncomplicated withdrawal ([place]) [F10.230]   . MDD (major depressive disorder), recurrent severe, without psychosis ([place]) [F33.2] 01/12/2015  . Cocaine dependence without complication ([place]) [S56.81] 01/12/2015  . Alcohol dependence ([place]) [F10.20] 01/12/2015    Total Time spent with patient: 30 minutes  Subjective:   Tyler Farrell is a 64 y.o. male patient admitted with due to depression, substance abuse, and issues with housing.   HPI:    Tyler Farrell is a 64 year old male who presented to the [facility] reporting depressive symptoms and suicidal thoughts. He was recently discharged from [facility] in August of 2016 but was not compliant on his medications. At that time the patient also expressed suicidal thoughts to walk in front of a care but denies having a past history of any attempts. He also has a long history of alcohol and cocaine use. Patient states "I have the bad thoughts. The real problem is my housing situation. It makes me depressed. The people there drink and I give into the temptation. I would not feel that way if I had a better place to live. I will be honest in saying it's tough out there for me and I'm getting older. I have not been taking care of my hepatitis either and it's making me weak. I just need a Education officer, museum to help me with better housing." During his recent hospital stay the patient was discharged on Campral and Lexapro. He was encouraged to follow up with [facility] after discharge to address his substance abuse issues. Patient appeared future oriented today being concerned with housing and did not talk at length about any desire to seriously hurt himself. He appeared to be focused on  his housing situation and identified that particular problem as his "primary issue". Patient agreed to he needed to follow up outpatient as previously recommended to remain on medications to help with his depressive symptoms. The patient's urine drug screen is positive for cocaine. He admits to his substance abuse causing problems with his mental health problems. Patient does not appear to be a danger to himself at this time and stated "I would not hurt myself if I could get some help with housing from the social worker."    HPI Elements:   Location:  depression, suicidal thoughts. Quality:  Thoughts to walk into traffic. Severity:  Moderate. Timing:  Last three days. Duration:  Chronic. Context:  No follow up after recent admission, alcohol and cocaine use.  Past Medical History:  Past Medical History  Diagnosis Date  . Hep C w/o coma, chronic ([place])   . Glaucoma   . Hypertension    History reviewed. No pertinent past surgical history. Family History: History reviewed. No pertinent family history. Social History:  History  Alcohol Use  . Yes    Comment: occ     History  Drug Use  . Yes  . Special: Marijuana, Cocaine    Social History   Social History  . Marital Status: Divorced    Spouse Name: N/A  . Number of Children: N/A  . Years of Education: N/A   Social History Main Topics  . Smoking status: Never Smoker   . Smokeless tobacco: None  . Alcohol Use: Yes  Comment: occ  . Drug Use: Yes    Special: Marijuana, Cocaine  . Sexual Activity: Not Asked   Other Topics Concern  . None   Social History Narrative   ** Merged History Encounter **       Additional Social History:    Pain Medications: pt denies abuse - see PTA meds Prescriptions: pt denies abuse - see PTA meds Over the Counter: pt denies abuse - see PTA meds History of alcohol / drug use?: Yes Name of Substance 1: alcohol 1 - Age of First Use: 16 1 - Amount (size/oz): three to four beers (12 oz or  40 oz) 1 - Frequency: daily 1 - Duration: years 1 - Last Use / Amount: 04/19/15 - one 40 oz beer Name of Substance 2: cocaine - powder 2 - Age of First Use:  22 2 - Amount (size/oz): varies - depends on what he can afford 2 - Frequency: every few weeks 2 - Duration: months 2 - Last Use / Amount: 3 weeks ago - $200 to $300                  Allergies:   Allergies  Allergen Reactions  . Penicillins Other (See Comments)    unknown    Labs:  Results for orders placed or performed during the hospital encounter of 04/22/15 (from the past 48 hour(s))  Basic metabolic panel     Status: Abnormal   Collection Time: 04/22/15  7:32 AM  Result Value Ref Range   Sodium 140 135 - 145 mmol/L   Potassium 3.3 (L) 3.5 - 5.1 mmol/L   Chloride 108 101 - 111 mmol/L   CO2 24 22 - 32 mmol/L   Glucose, Bld 95 65 - 99 mg/dL   BUN 12 6 - 20 mg/dL   Creatinine, Ser 1.02 0.61 - 1.24 mg/dL   Calcium 9.1 8.9 - 10.3 mg/dL   GFR calc non Af Amer >60 >60 mL/min   GFR calc Af Amer >60 >60 mL/min    Comment: (NOTE) The eGFR has been calculated using the CKD EPI equation. This calculation has not been validated in all clinical situations. eGFR's persistently <60 mL/min signify possible Chronic Kidney Disease.    Anion gap 8 5 - 15  CBC with Differential     Status: Abnormal   Collection Time: 04/22/15  7:32 AM  Result Value Ref Range   WBC 5.5 4.0 - 10.5 K/uL   RBC 4.16 (L) 4.22 - 5.81 MIL/uL   Hemoglobin 13.6 13.0 - 17.0 g/dL   HCT 40.1 39.0 - 52.0 %   MCV 96.4 78.0 - 100.0 fL   MCH 32.7 26.0 - 34.0 pg   MCHC 33.9 30.0 - 36.0 g/dL   RDW 13.2 11.5 - 15.5 %   Platelets 236 150 - 400 K/uL   Neutrophils Relative % 60 %   Neutro Abs 3.3 1.7 - 7.7 K/uL   Lymphocytes Relative 28 %   Lymphs Abs 1.5 0.7 - 4.0 K/uL   Monocytes Relative 9 %   Monocytes Absolute 0.5 0.1 - 1.0 K/uL   Eosinophils Relative 2 %   Eosinophils Absolute 0.1 0.0 - 0.7 K/uL   Basophils Relative 1 %   Basophils Absolute 0.0  0.0 - 0.1 K/uL  Ethanol     Status: None   Collection Time: 04/22/15  7:32 AM  Result Value Ref Range   Alcohol, Ethyl (B) <5 <5 mg/dL    Comment:  LOWEST DETECTABLE LIMIT FOR SERUM ALCOHOL IS 5 mg/dL FOR MEDICAL PURPOSES ONLY   I-stat troponin, ED     Status: None   Collection Time: 04/22/15  7:37 AM  Result Value Ref Range   Troponin i, poc 0.01 0.00 - 0.08 ng/mL   Comment 3            Comment: Due to the release kinetics of cTnI, a negative result within the first hours of the onset of symptoms does not rule out myocardial infarction with certainty. If myocardial infarction is still suspected, repeat the test at appropriate intervals.   Urine rapid drug screen (hosp performed)     Status: Abnormal   Collection Time: 04/22/15  8:53 AM  Result Value Ref Range   Opiates NONE DETECTED NONE DETECTED   Cocaine POSITIVE (A) NONE DETECTED   Benzodiazepines NONE DETECTED NONE DETECTED   Amphetamines NONE DETECTED NONE DETECTED   Tetrahydrocannabinol NONE DETECTED NONE DETECTED   Barbiturates NONE DETECTED NONE DETECTED    Comment:        DRUG SCREEN FOR MEDICAL PURPOSES ONLY.  IF CONFIRMATION IS NEEDED FOR ANY PURPOSE, NOTIFY LAB WITHIN 5 DAYS.        LOWEST DETECTABLE LIMITS FOR URINE DRUG SCREEN Drug Class       Cutoff (ng/mL) Amphetamine      1000 Barbiturate      200 Benzodiazepine   376 Tricyclics       283 Opiates          300 Cocaine          300 THC              50     Vitals: Blood pressure 144/105, pulse 65, temperature 97 F (36.1 C), temperature source Oral, resp. rate 16, SpO2 100 %.  Risk to Self: Suicidal Ideation: Yes-Currently Present Suicidal Intent: No Is patient at risk for suicide?: Yes Suicidal Plan?: No Access to Means:  (n/a) What has been your use of drugs/alcohol within the last 12 months?: daily alcohol, cocaine every 3 wks How many times?:  (pt denies but earlier assessment pt reported 2 attempts) Other Self Harm Risks:  none Intentional Self Injurious Behavior: None Risk to Others: Homicidal Ideation: No Thoughts of Harm to Others: No Current Homicidal Intent: No Current Homicidal Plan: No Access to Homicidal Means: No Identified Victim: none History of harm to others?: No Assessment of Violence: None Noted Violent Behavior Description: pt denies hx violence - calm  Does patient have access to weapons?: No Criminal Charges Pending?: No Does patient have a court date: No Prior Inpatient Therapy: Prior Inpatient Therapy: Yes Prior Therapy Dates: Aug 2016, 2011, 2010 Prior Therapy Facilty/Provider(s): [place], [place] Reason for Treatment: depression, SI, schizophrenia Prior Outpatient Therapy: Prior Outpatient Therapy: No Prior Therapy Dates: na Prior Therapy Facilty/Provider(s): na Reason for Treatment: na Does patient have an ACCT team?: No Does patient have Intensive In-House Services?  : No Does patient have [facility] services? : Unknown Does patient have P4CC services?: Unknown  Current Facility-Administered Medications  Medication Dose Route Frequency Provider Last Rate Last Dose  . amLODipine (NORVASC) tablet 10 mg  10 mg Oral Daily Quintella Reichert, MD   10 mg at 04/23/15 1104  . dorzolamide (TRUSOPT) 2 % ophthalmic solution 1 drop  1 drop Both Eyes TID Quintella Reichert, MD   1 drop at 04/23/15 1105   Current Outpatient Prescriptions  Medication Sig Dispense Refill  . amLODipine (NORVASC) 10 MG  tablet Take 1 tablet (10 mg total) by mouth daily. 30 tablet 0  . dorzolamide (TRUSOPT) 2 % ophthalmic solution Place 1 drop into both eyes 3 (three) times daily. 10 mL 0  . meclizine (ANTIVERT) 25 MG tablet Take 1 tablet (25 mg total) by mouth 3 (three) times daily as needed for dizziness. 20 tablet 0    Musculoskeletal: Strength & Muscle Tone: within normal limits Gait & Station: normal Patient leans: N/A  Psychiatric Specialty Exam: Physical Exam  Review of Systems   Constitutional: Negative.   HENT: Negative.   Eyes: Negative.   Respiratory: Negative.   Cardiovascular: Negative.   Gastrointestinal: Negative.   Genitourinary: Negative.   Musculoskeletal: Negative.   Skin: Negative.   Neurological: Negative.   Endo/Heme/Allergies: Negative.   Psychiatric/Behavioral: Positive for depression (Stable ) and substance abuse (UDS positive for cocaine).    Blood pressure 144/105, pulse 65, temperature 97 F (36.1 C), temperature source Oral, resp. rate 16, SpO2 100 %.There is no weight on file to calculate BMI.  General Appearance: Disheveled  Eye Contact::  Good  Speech:  Clear and Coherent  Volume:  Normal  Mood:  Depressed  Affect:  Flat  Thought Process:  Coherent and Goal Directed  Orientation:  Full (Time, Place, and Person)  Thought Content:  Worries about his housing situation  Suicidal Thoughts:  Yes.  without intent/plan  Homicidal Thoughts:  No  Memory:  Immediate;   Good Recent;   Good Remote;   Good  Judgement:  Fair  Insight:  Present  Psychomotor Activity:  Normal  Concentration:  Good  Recall:  Good  Fund of Knowledge:Good  Language: Good  Akathisia:  No  Handed:  Right  AIMS (if indicated):     Assets:  Communication Skills Desire for Improvement Leisure Time Resilience Talents/Skills  ADL's:  Intact  Cognition: WNL  Sleep:      Medical Decision Making: Established Problem, Stable/Improving (1), Review of Psycho-Social Stressors (1), Review or order clinical lab tests (1) and Review of Medication Regimen & Side Effects (2)  Plan:  No evidence of imminent risk to self or others at present.   Patient does not meet criteria for psychiatric inpatient admission. Supportive therapy provided about ongoing stressors. Discussed crisis plan, support from social network, calling 911, coming to the Emergency Department, and calling Suicide Hotline. Disposition: Discharge with appropriate social work referrals for housing and  for outpatient psychiatric services.   Elmarie Shiley, NP-C 04/23/2015 11:44 AM

## 2015-04-23 NOTE — Discharge Instructions (Signed)
°Emergency Department Resource Guide °1) Find a Doctor and Pay Out of Pocket °Although you won't have to find out who is covered by your insurance plan, it is a good idea to ask around and get recommendations. You will then need to call the office and see if the doctor you have chosen will accept you as a new patient and what types of options they offer for patients who are self-pay. Some doctors offer discounts or will set up payment plans for their patients who do not have insurance, but you will need to ask so you aren't surprised when you get to your appointment. ° °2) Contact Your Local Health Department °Not all health departments have doctors that can see patients for sick visits, but many do, so it is worth a call to see if yours does. If you don't know where your local health department is, you can check in your phone book. The CDC also has a tool to help you locate your state's health department, and many state websites also have listings of all of their local health departments. ° °3) Find a Walk-in Clinic °If your illness is not likely to be very severe or complicated, you may want to try a walk in clinic. These are popping up all over the country in pharmacies, drugstores, and shopping centers. They're usually staffed by nurse practitioners or physician assistants that have been trained to treat common illnesses and complaints. They're usually fairly quick and inexpensive. However, if you have serious medical issues or chronic medical problems, these are probably not your best option. ° °No Primary Care Doctor: °- Call Health Connect at  832-8000 - they can help you locate a primary care doctor that  accepts your insurance, provides certain services, etc. °- Physician Referral Service- 1-800-533-3463 ° °Chronic Pain Problems: °Organization         Address  Phone   Notes  °[facility]  (336) [phone] Patients need to be referred by their primary care doctor.  ° °Medication  Assistance: °Organization         Address  Phone   Notes  °[place] [facility] [address] °[place], [place] [address] (336) 641-8030 --Must be a resident of [place] °-- Must have NO insurance coverage whatsoever (no Medicaid/ Medicare, etc.) °-- The pt. MUST have a primary care doctor that directs their care regularly and follows them in the community °  °[facility]  (866) 331-1348   °[employer]  (888) 892-1162   ° °Agencies that provide inexpensive medical care: °Organization         Address  Phone   Notes  °[facility]  (336) 832-8035   °[facility]    (336) 832-7272   °[facility] [address] °[place], [place] [address] (336) 832-4777   °[facility] [address], °[place] (336) 271-4999   °[facility]    (336) 373-0678   °[facility]    (336) 272-1050   °[facility] ° [address], [place] Phone:  (336) 832-4444, Fax:  (336) 832-4440 Hours of Operation:  9 am - 6 pm, M-F.  Also accepts Medicaid/Medicare and self-pay.  ° [facility] ° [address],  Phone: (336) 832-3150, Fax: (336) [phone]. Hours of Operation:  8:30 am - 5:30 pm, M-F.  Also accepts Medicaid and self-pay.  °[facility] [place] [address], [place] Phone: (336) 878-6027   °[facility] [address], [place], [place] (336)723-1848, Ext. 123 Mondays & Thursdays: 7-9 AM.  First 15 patients are seen on a first come, first serve basis. °  ° °Medicaid-accepting [place] Providers: ° °Organization         Address  Phone   Notes  °[facility] [address], [place] (336) 641-2100 Also accepts self-pay patients.  °[facility] [address], [place] ° (336) 856-9996   °[facility] [address], [place]  (336) 288-8857   °[facility] [address] [place] [address], [place] (336) 299-7000   °Veita Bland [address], [place]  ° (336) 373-1557 Only accepts [place] Access Medicaid patients after they have their name applied to their card.  ° °Self-Pay (no insurance) in [place]: ° °Organization         Address  Phone   Notes  °Sickle Cell Patients, [facility] [address], [place] (336) 832-1970   °[facility] [address], [place] (336) 832-4400   °[facility] [place] ° [address], [place] (336) 992-4800   °[facility]/Dr. Osei-Bonsu ° [address] [place] [address], [place] or [address], [place] (336) 841-8500 Phone number for both [place] and [place] locations is the same.  °[facility] [address], [place] (336) 299-0000   °[facility] [address] [place] [address], [place] or [address] (336) 852-7530 °(336) 878-2260   °[facility] [address], [place] (336) 350-1642, phone; (336) 294-5005, fax Sees patients 1st and 3rd Saturday of every month.  Must not qualify for public or private insurance (i.e. Medicaid, Medicare, [place] Health Choice, Veterans' Benefits) • Household income should be no more than 200% of the poverty level •The clinic cannot treat you if you are pregnant or think you are pregnant • Sexually transmitted diseases are not treated at the clinic.  ° ° °Dental Care: °Organization         Address  Phone  Notes  °[place] [facility] [address], [place] (336) 641-6152 Accepts children up to age 21 who are enrolled in Medicaid or [place] Health Choice; pregnant women with a Medicaid card; and children who have applied for Medicaid or [place] Health Choice, but were declined, whose parents can pay a reduced fee at time of service.  °[place]  [facility] [place]  [address], [place] (336) 641-7733 Accepts children up to age 21 who are enrolled in Medicaid or [place] Health Choice; pregnant women with a Medicaid card; and children who have applied for Medicaid or [place] Health Choice, but were declined, whose parents can pay a reduced fee at time of service.  °[facility] ° [address], [place] (336) 641-4533 Patients are seen by appointment only. Walk-ins are not accepted. [facility] will see patients 18 years of age and older. °Monday - Tuesday (8am-5pm) °Most Wednesdays (8:30-5pm) °$30 per visit, cash only  °[facility] ° [address], [place] (336) 641-4533 Patients are seen by appointment only. Walk-ins are not accepted. [facility] will see patients 18 years of age and older. °One   Wednesday Evening (Monthly: Volunteer Based).  $30 per visit, cash only  °[facility]  (919) 537-3737 for adults; Children under age 4, call Graduate Pediatric Dentistry at (919) [phone]. Children aged 4-14, please call (919) [phone] to request a pediatric application. ° Dental services are provided in all areas of dental care including fillings, crowns and bridges, complete and partial dentures, implants, gum treatment, root canals, and extractions. Preventive care is also provided. Treatment is provided to both adults and children. °Patients are selected via a lottery and there is often a waiting list. °  °[facility] [address], °[place] ° (336) 763-8833 [email or web address] °  °[facility] [address], [place], [place] (336)723-1848, Ext. 123 Second and Fourth Thursday of each month, opens at 6:30 AM; Clinic ends at 9 AM.  Patients are seen on a first-come first-served basis, and a limited number are seen during each clinic.  ° °[facility] ° [address], [place], [place] (336) [phone]    Eligibility Requirements °You must have lived in [place], [place], or [place] counties for at least the last three months. °  You cannot be eligible for state or federal sponsored healthcare insurance, including Veterans Administration, Medicaid, or Medicare. °  You generally cannot be eligible for healthcare insurance through your employer.  °  How to apply: °Eligibility screenings are held every Tuesday and Wednesday afternoon from 1:00 pm until 4:00 pm. You do not need an appointment for the interview!  °[facility] [address], [place], [place] 336-631-2330   °[facility] [facility]  336-342-8273   °[facility]  336-703-3100   °[facility]  336-570-6415   ° °Behavioral Health Resources in the Community: °Intensive Outpatient Programs °Organization         Address  Phone  Notes  °[facility] [address], [place], [place] 336-878-6098   °[facility] [address], [place], [place] 336-832-9800   °[facility] [address], [place], [place] ° 336-882-2125   °[facility] [address],  °[place], [place] 1-800-853-5163 or 336-641-4981   °Substance Abuse Resources °Organization         Address  Phone  Notes  °[facility]  336-882-2125   °[facility]  336-784-9470   °[facility]  336-285-9073   °[facility]  336-845-3988   °Residential & Outpatient Substance Abuse Program  1-800-659-3381   °Psychological Services °Organization         Address  Phone  Notes  °[facility]  336- 832-9600   °[facility]  336- 378-7881   °[facility] [address], [place] 1-800-853-5163 or 336-641-4981   ° °Mobile Crisis Teams °Organization         Address  Phone  Notes  °[facility], Mobile Crisis Care Unit  1-877-626-1772   °Assertive °[facility] ° [address]  [place], [place] 336-834-9664   °Sharon DeEsch [address] °[place] 336-554-5454   ° °Self-Help/Support Groups °Organization         Address  Phone             Notes  °[facility]  - variety of support groups  336- 373-1402 Call for more information  °Narcotics Anonymous (NA), [facility] [address], °[place]   2 meetings at this location  ° °  Residential Treatment Programs °Organization         Address  Phone  Notes  °[facility] [address],    °[place]  1-866-801-8205   °[facility] ° [address], [place], [place] 704-293-8524   °[facility] [address], [place] 336-845-3988 Admissions: 8am-3pm M-F  °[facility] [address]    °[place], [place] 336-841-1104   °[facility] [address], [place], [place] 336-379-7146   °[facility] [address]  °[place], [place] 336-285-9073   °[facility] Intensive Outpatient [address], [place], [place] 336-852-3033   °[facility] [address]  °[place], [place] 1-877-615-2722 or 336-784-9470   °[facility] [address] [place], [place] 336-227-7417 Accepts Medicaid  °[facility] [address]  °[place] 1-800-659-3381 Substance Abuse/Addiction Treatment  ° °[place] Behavioral Health Resources °Organization         Address  Phone  Notes  °[facility]  (888) 581-9988   °Julie Brannon, PhD [address] [place], [place]   (336) 349-5553 or (336) 951-0000   °[facility]   [address] °[place], [place] (336) 349-4454   °[facility] [address], [place], [place] (336) 342-8316 Insurance/Medicaid/sponsorship through [facility]  °[facility] [address] [place]                                    [place], [place] (336) 342-8316 Therapy/tele-psych/case    °[facility] [address]  ° [place], [place] (336) 349-2233    °Dr. Arfeen  (336) 349-4544   °[facility] [place]  [employer] [place] [facility] 1) [address], [place] °2) [address], [place] °3)  [address], [place] (336) 349-3220 °(336) 342-7768 ° °(336) 342-8140   °[place] Child Abuse Hotline (336) 342-1394 or (336) 342-3537 (After Hours)    ° ° °

## 2015-04-23 NOTE — ED Provider Notes (Signed)
Received care of pt at 9AM. Please see prior notes for history, physical and prior care. Briefly this is a 64yo male who presents with depression, suicidal ideation.  Pt evaluated by psychiatry this morning and feel he is safe for discharge.  Evaluated patient prior to time of discharge and he denies SI/HI and contracts for safety.  Patient discharged in stable condition with understanding of reasons to return.   Alvira MondayErin Caoimhe Damron, MD 04/24/15 1005

## 2015-04-23 NOTE — Progress Notes (Signed)
CSW received notification from RN Kriste BasqueBecky that patient is being discharged and would like resources for homeless shelters. CSW introduced self and acknowledged the patient. Patient is alert and oriented. Patient reports that he currently resides in a [employer]" with five other residents. Patient reports he wanted to know if outpatient resources were available for behavioral health. CSW provided patient with a list of outpatient behavioral health facilities. CSW also provided patient with resources for the [facility] and [employer]. Patient was very appreciative of the resources provided by CSW. No further CSW needs were requested at this time. CSW to sign off. Please consult if further CSW needs arise.   Fernande BoydenJoyce Carolos Fecher, LCSWA Clinical Social Worker [facility] Ph: [phone]

## 2015-06-07 ENCOUNTER — Emergency Department (HOSPITAL_COMMUNITY)
Admission: EM | Admit: 2015-06-07 | Discharge: 2015-06-08 | Disposition: A | Discharge status: Other Acute Inpatient Hospital | Payer: Medicare Other | Attending: Emergency Medicine | Admitting: Emergency Medicine

## 2015-06-07 ENCOUNTER — Emergency Department (HOSPITAL_COMMUNITY): Payer: Medicare Other

## 2015-06-07 ENCOUNTER — Encounter (HOSPITAL_COMMUNITY): Payer: Self-pay | Admitting: Emergency Medicine

## 2015-06-07 DIAGNOSIS — F1410 Cocaine abuse, uncomplicated: Secondary | ICD-10-CM | POA: Insufficient documentation

## 2015-06-07 DIAGNOSIS — Z7289 Other problems related to lifestyle: Secondary | ICD-10-CM

## 2015-06-07 DIAGNOSIS — R440 Auditory hallucinations: Secondary | ICD-10-CM | POA: Insufficient documentation

## 2015-06-07 DIAGNOSIS — Z880 Allergy status to penicillin: Secondary | ICD-10-CM | POA: Insufficient documentation

## 2015-06-07 DIAGNOSIS — H409 Unspecified glaucoma: Secondary | ICD-10-CM | POA: Insufficient documentation

## 2015-06-07 DIAGNOSIS — Z79899 Other long term (current) drug therapy: Secondary | ICD-10-CM | POA: Diagnosis not present

## 2015-06-07 DIAGNOSIS — Z8619 Personal history of other infectious and parasitic diseases: Secondary | ICD-10-CM | POA: Insufficient documentation

## 2015-06-07 DIAGNOSIS — F1010 Alcohol abuse, uncomplicated: Secondary | ICD-10-CM | POA: Insufficient documentation

## 2015-06-07 DIAGNOSIS — R45851 Suicidal ideations: Secondary | ICD-10-CM | POA: Diagnosis present

## 2015-06-07 DIAGNOSIS — R0789 Other chest pain: Secondary | ICD-10-CM | POA: Diagnosis not present

## 2015-06-07 DIAGNOSIS — I10 Essential (primary) hypertension: Secondary | ICD-10-CM | POA: Insufficient documentation

## 2015-06-07 DIAGNOSIS — F1490 Cocaine use, unspecified, uncomplicated: Secondary | ICD-10-CM

## 2015-06-07 DIAGNOSIS — Z789 Other specified health status: Secondary | ICD-10-CM

## 2015-06-07 LAB — CBC WITH DIFFERENTIAL/PLATELET
Basophils Absolute: 0 10*3/uL (ref 0.0–0.1)
Basophils Relative: 1 %
EOS ABS: 0.1 10*3/uL (ref 0.0–0.7)
EOS PCT: 2 %
HCT: 42 % (ref 39.0–52.0)
Hemoglobin: 13.7 g/dL (ref 13.0–17.0)
LYMPHS ABS: 2 10*3/uL (ref 0.7–4.0)
LYMPHS PCT: 33 %
MCH: 32.3 pg (ref 26.0–34.0)
MCHC: 32.6 g/dL (ref 30.0–36.0)
MCV: 99.1 fL (ref 78.0–100.0)
MONO ABS: 0.7 10*3/uL (ref 0.1–1.0)
MONOS PCT: 12 %
Neutro Abs: 3.3 10*3/uL (ref 1.7–7.7)
Neutrophils Relative %: 52 %
PLATELETS: 204 10*3/uL (ref 150–400)
RBC: 4.24 MIL/uL (ref 4.22–5.81)
RDW: 13.3 % (ref 11.5–15.5)
WBC: 6.2 10*3/uL (ref 4.0–10.5)

## 2015-06-07 LAB — COMPREHENSIVE METABOLIC PANEL
ALK PHOS: 56 U/L (ref 38–126)
ALT: 40 U/L (ref 17–63)
AST: 40 U/L (ref 15–41)
Albumin: 3.7 g/dL (ref 3.5–5.0)
Anion gap: 12 (ref 5–15)
BILIRUBIN TOTAL: 0.8 mg/dL (ref 0.3–1.2)
BUN: 17 mg/dL (ref 6–20)
CALCIUM: 9.2 mg/dL (ref 8.9–10.3)
CO2: 23 mmol/L (ref 22–32)
Chloride: 107 mmol/L (ref 101–111)
Creatinine, Ser: 1.15 mg/dL (ref 0.61–1.24)
GFR calc Af Amer: >60 mL/min (ref >60)
Glucose, Bld: 84 mg/dL (ref 65–99)
POTASSIUM: 3.6 mmol/L (ref 3.5–5.1)
Sodium: 142 mmol/L (ref 135–145)
TOTAL PROTEIN: 6.8 g/dL (ref 6.5–8.1)

## 2015-06-07 LAB — ETHANOL

## 2015-06-07 LAB — ACETAMINOPHEN LEVEL: Acetaminophen (Tylenol), Serum: <10 ug/mL — ABNORMAL LOW (ref 10–30)

## 2015-06-07 LAB — RAPID URINE DRUG SCREEN, HOSP PERFORMED
Amphetamines: NOT DETECTED
BARBITURATES: NOT DETECTED
Benzodiazepines: NOT DETECTED
COCAINE: POSITIVE — AB
OPIATES: NOT DETECTED
TETRAHYDROCANNABINOL: NOT DETECTED

## 2015-06-07 LAB — SALICYLATE LEVEL: Salicylate Lvl: <4 mg/dL (ref 2.8–30.0)

## 2015-06-07 LAB — I-STAT TROPONIN, ED: TROPONIN I, POC: 0.01 ng/mL (ref 0.00–0.08)

## 2015-06-07 MED ORDER — DORZOLAMIDE HCL 2 % OP SOLN
1.0000 [drp] | Freq: Three times a day (TID) | OPHTHALMIC | Status: DC
  Administered 2015-06-07 – 2015-06-08 (×3): 1 [drp] via OPHTHALMIC
  Filled 2015-06-07: qty 10

## 2015-06-07 MED ORDER — LORAZEPAM 1 MG PO TABS
1.0000 mg | ORAL_TABLET | Freq: Three times a day (TID) | ORAL | Status: DC | PRN
  Administered 2015-06-08: 1 mg via ORAL
  Filled 2015-06-07: qty 1

## 2015-06-07 MED ORDER — ACETAMINOPHEN 325 MG PO TABS: 650.0000 mg | ORAL_TABLET | ORAL | Status: DC | PRN

## 2015-06-07 MED ORDER — IBUPROFEN 400 MG PO TABS
600.0000 mg | ORAL_TABLET | Freq: Three times a day (TID) | ORAL | Status: DC | PRN
  Administered 2015-06-07: 600 mg via ORAL
  Filled 2015-06-07: qty 1

## 2015-06-07 MED ORDER — GI COCKTAIL ~~LOC~~
30.0000 mL | Freq: Once | ORAL | Status: AC
  Administered 2015-06-07: 30 mL via ORAL
  Filled 2015-06-07: qty 30

## 2015-06-07 MED ORDER — MECLIZINE HCL 25 MG PO TABS
25.0000 mg | ORAL_TABLET | Freq: Three times a day (TID) | ORAL | Status: DC | PRN
  Administered 2015-06-08: 25 mg via ORAL
  Filled 2015-06-07: qty 1

## 2015-06-07 MED ORDER — ALUM & MAG HYDROXIDE-SIMETH 200-200-20 MG/5ML PO SUSP: 30.0000 mL | ORAL | Status: DC | PRN

## 2015-06-07 MED ORDER — ONDANSETRON HCL 4 MG PO TABS: 4.0000 mg | ORAL_TABLET | Freq: Three times a day (TID) | ORAL | Status: DC | PRN

## 2015-06-07 MED ORDER — AMLODIPINE BESYLATE 5 MG PO TABS
10.0000 mg | ORAL_TABLET | Freq: Every day | ORAL | Status: DC
  Administered 2015-06-07 – 2015-06-08 (×2): 10 mg via ORAL
  Filled 2015-06-07 (×2): qty 2

## 2015-06-07 MED ORDER — ZOLPIDEM TARTRATE 5 MG PO TABS: 5.0000 mg | ORAL_TABLET | Freq: Every evening | ORAL | Status: DC | PRN

## 2015-06-07 MED ORDER — SODIUM CHLORIDE 0.9 % IV BOLUS (SEPSIS)
1000.0000 mL | Freq: Once | INTRAVENOUS | Status: AC
  Administered 2015-06-07: 1000 mL via INTRAVENOUS

## 2015-06-07 NOTE — ED Notes (Signed)
TTS talking with patient at this time.  

## 2015-06-07 NOTE — ED Provider Notes (Signed)
CSN: [id number]     Arrival date & time 06/07/15  1551 History   First MD Initiated Contact with Patient 06/07/15 1551     Chief Complaint  Patient presents with  . Chest Pain     (Consider location/radiation/quality/duration/timing/severity/associated sxs/prior Treatment) HPI Comments: Tyler Farrell is a 64 y.o. male with a PMHx of HTN, Hep C, glaucoma, and heroin use, who presents to the ED with complaints of chest pain began at 6 AM while he was drinking two 40 ounce beers. He describes the pain is 7/10 sharp intermittent central chest pain which is nonradiating, worse with movement, and greatly improved with aspirin and 2 nitroglycerin given en route. He also admits to using 1 g of cocaine last night around 6 PM which was approximately 12 hours prior to onset of symptoms. Additionally he reports suicidal ideations with a plan to jump in front of cars as well as auditory hallucinations hearing voices. He admits to not taking his psychiatric medications in the last 4 months. He is a nonsmoker. +Family history of MI in his mother and father. He has had similar symptoms in the past after drinking and using drugs, last ER visit was 01/11/2015 for this.   He denies any fevers, chills, shortness of breath, diaphoresis, lightheadedness, syncope, claudication, orthopnea, leg swelling, recent travel/surgery/immobilization, history of DVT/PE, abdominal pain, nausea, vomiting, diarrhea, constipation, dysuria, hematuria, numbness, tingling, weakness, or HI.  Patient is a 64 y.o. male presenting with chest pain. The history is provided by the patient and medical records. No language interpreter was used.  Chest Pain Pain location:  Substernal area Pain quality: sharp   Pain radiates to:  Does not radiate Pain radiates to the back: no   Pain severity:  Moderate Onset quality:  Sudden Duration:  10 hours Timing:  Intermittent Progression:  Unchanged Chronicity:  Recurrent Context: drug use and at rest     Relieved by:  Aspirin and nitroglycerin Worsened by:  Movement Ineffective treatments:  None tried Associated symptoms: no abdominal pain, no claudication, no diaphoresis, no fever, no lower extremity edema, no nausea, no numbness, no orthopnea, no shortness of breath, no syncope, not vomiting and no weakness   Risk factors: hypertension and male sex   Risk factors: no coronary artery disease, no diabetes mellitus, no high cholesterol, no immobilization, no prior DVT/PE, no smoking and no surgery     Past Medical History  Diagnosis Date  . Hep C w/o coma, chronic (HCC)   . Glaucoma   . Hypertension    No past surgical history on file. No family history on file. Social History  Substance Use Topics  . Smoking status: Never Smoker   . Smokeless tobacco: Not on file  . Alcohol Use: Yes     Comment: occ    Review of Systems  Constitutional: Negative for fever, chills and diaphoresis.  Respiratory: Negative for shortness of breath.   Cardiovascular: Positive for chest pain. Negative for orthopnea, claudication, leg swelling and syncope.  Gastrointestinal: Negative for nausea, vomiting, abdominal pain, diarrhea and constipation.  Genitourinary: Negative for dysuria and hematuria.  Musculoskeletal: Negative for myalgias and arthralgias.  Skin: Negative for color change.  Allergic/Immunologic: Negative for immunocompromised state.  Neurological: Negative for syncope, weakness, light-headedness and numbness.  Psychiatric/Behavioral: Positive for suicidal ideas and hallucinations (auditory). Negative for confusion.   10 Systems reviewed and are negative for acute change except as noted in the HPI.    Allergies  Penicillins  Home Medications   Prior  to Admission medications   Medication Sig Start Date End Date Taking? Authorizing Provider  amLODipine (NORVASC) 10 MG tablet Take 1 tablet (10 mg total) by mouth daily. 01/18/15   Beau FannyJohn C Withrow, FNP  dorzolamide (TRUSOPT) 2 %  ophthalmic solution Place 1 drop into both eyes 3 (three) times daily. 01/18/15   Beau FannyJohn C Withrow, FNP  meclizine (ANTIVERT) 25 MG tablet Take 1 tablet (25 mg total) by mouth 3 (three) times daily as needed for dizziness. 02/01/15   Gilda Creasehristopher J Pollina, MD   BP 124/85 mmHg  Pulse 81  Temp(Src) 99 F (37.2 C) (Oral)  Resp 19  SpO2 99% Physical Exam  Constitutional: He is oriented to person, place, and time. Vital signs are normal. He appears well-developed and well-nourished.  Non-toxic appearance. No distress.  Afebrile, nontoxic, NAD  HENT:  Head: Normocephalic and atraumatic.  Mouth/Throat: Oropharynx is clear and moist and mucous membranes are normal.  Eyes: Conjunctivae and EOM are normal. Right eye exhibits no discharge. Left eye exhibits no discharge.  Neck: Normal range of motion. Neck supple.  Cardiovascular: Normal rate, regular rhythm, normal heart sounds and intact distal pulses.  Exam reveals no gallop and no friction rub.   No murmur heard. RRR, nl s1/s2, no m/r/g, distal pulses intact, no pedal edema  Pulmonary/Chest: Effort normal and breath sounds normal. No respiratory distress. He has no decreased breath sounds. He has no wheezes. He has no rhonchi. He has no rales. He exhibits tenderness. He exhibits no crepitus, no deformity and no retraction.    CTAB in all lung fields, no w/r/r, no hypoxia or increased WOB, speaking in full sentences, SpO2 99% on RA  Chest wall with mild TTP over sternum, no crepitus or retractions, no deformities  Abdominal: Soft. Normal appearance and bowel sounds are normal. He exhibits no distension. There is no tenderness. There is no rigidity, no rebound, no guarding, no CVA tenderness, no tenderness at McBurney's point and negative Murphy's sign.  Musculoskeletal: Normal range of motion.  MAE x4 Strength and sensation grossly intact Distal pulses intact No pedal edema, neg homan's bilaterally   Neurological: He is alert and oriented to  person, place, and time. He has normal strength. No sensory deficit.  Skin: Skin is warm, dry and intact. No rash noted.  Psychiatric: He has a normal mood and affect. He is actively hallucinating (reports auditory hallucinations). He expresses suicidal ideation. He expresses no homicidal ideation. He expresses suicidal plans. He expresses no homicidal plans.  Endorses SI with plan to jump in front of cars, endorses auditory hallucinations hearing voices. Denies visual hallucinations or HI  Nursing note and vitals reviewed.   ED Course  Procedures (including critical care time) Labs Review Labs Reviewed  ACETAMINOPHEN LEVEL - Abnormal; Notable for the following:    Acetaminophen (Tylenol), Serum <10 (*)    All other components within normal limits  URINE RAPID DRUG SCREEN, HOSP PERFORMED - Abnormal; Notable for the following:    Cocaine POSITIVE (*)    All other components within normal limits  COMPREHENSIVE METABOLIC PANEL  ETHANOL  SALICYLATE LEVEL  CBC WITH DIFFERENTIAL/PLATELET  Rosezena SensorI-STAT TROPOININ, ED    Imaging Review Dg Chest 2 View  06/07/2015  CLINICAL DATA:  Patient mid sternal chest pain.  Lightheadedness. EXAM: CHEST  2 VIEW COMPARISON:  Chest radiograph 04/22/2015. FINDINGS: Stable cardiac and mediastinal contours. Persistent elevation left hemidiaphragm with left basilar heterogeneous opacities. The right lung is clear. No pleural effusion or pneumothorax. Mid thoracic spine  degenerative changes. IMPRESSION: Persistent elevation left hemidiaphragm with left basilar opacities favored to represent atelectasis. No acute cardiopulmonary process. Electronically Signed   By: Annia Belt M.D.   On: 06/07/2015 17:03   I have personally reviewed and evaluated these images and lab results as part of my medical decision-making.   EKG Interpretation None     ED ECG REPORT   Date: 06/07/2015  Rate: 79  Rhythm: normal sinus rhythm  QRS Axis: normal  Intervals: normal  ST/T Wave  abnormalities: nonspecific ST/T changes  Conduction Disutrbances:none  Narrative Interpretation:   Old EKG Reviewed: unchanged; TWI in V3-4 and limb lead I/II which is unchanged from 04/2015  I have personally reviewed the EKG tracing and agree with the computerized printout as noted.   MDM   Final diagnoses:  Atypical chest pain  Suicidal ideation  Auditory hallucination  Cocaine use  Alcohol use (HCC)    64 y.o. male here with CP after drinking this AM and using cocaine last night. Reproducible on exam. Also reports SI and auditory hallucinations, hasn't been taking his psych meds in 4months. No tachycardia or hypoxia, doubt PE, doubt dissection. ASA and NTG helped with symptoms. Will give GI cocktail as I feel this is possibly either alcoholic induced gastric irritation or could be due to cocaine use. Will hold off on morphine or more NTG. Will give fluids, get labs including med clearance labs, and reassess shortly. Once medically cleared, will get TTS consultation.   6:04 PM Pain resolved after GI cocktail. EKG unchanged from prior, no acute ischemic changes. CMP, EtOH, salicylate, tylenol level, CBC, and trop neg. CXR unremarkable for acute changes. UDS with +cocaine. Given that CP is resolved at this time, and trop at 12hr mark is neg, doubt need for ongoing management. Likely indigestion related or from his alcohol intake/drug use. Pt medically cleared at this time. Currently eating a meal and calm at this time. Psych hold orders placed, TTS consulted, please see [id number] notes for further documentation of care.   BP 132/100 mmHg  Pulse 79  Temp(Src) 99 F (37.2 C) (Oral)  Resp 17  SpO2 100%  Meds ordered this encounter  Medications  . gi cocktail (Maalox,Lidocaine,Donnatal)    Sig:   . sodium chloride 0.9 % bolus 1,000 mL    Sig:   . alum & mag hydroxide-simeth (MAALOX/MYLANTA) 200-200-20 MG/5ML suspension 30 mL    Sig:   . ondansetron (ZOFRAN) tablet 4 mg    Sig:   .  zolpidem (AMBIEN) tablet 5 mg    Sig:   . ibuprofen (ADVIL,MOTRIN) tablet 600 mg    Sig:   . acetaminophen (TYLENOL) tablet 650 mg    Sig:   . LORazepam (ATIVAN) tablet 1 mg    Sig:   . meclizine (ANTIVERT) tablet 25 mg    Sig:   . amLODipine (NORVASC) tablet 10 mg    Sig:   . dorzolamide (TRUSOPT) 2 % ophthalmic solution 1 drop    Sig:       Ji Feldner Camprubi-Soms, PA-C 06/07/15 1806  Rolland Porter, MD 06/16/15 [phone]

## 2015-06-07 NOTE — BH Assessment (Addendum)
Tele Assessment Note   Tyler Farrell is an 64 y.o. male [facility]/o having SI w/ a plan and hearing voices. Pt reported that [facility] for Paranoid Sch[facility]ia, but never got the prescriptions filled, as he felt he didn't need them. Pt indicates that he is now beginning to hear voices again. He states that the voices just continually call his name, but are not saying anything else. Pt also shared that he is having SI with a plan to step out in front of a car. Pt denies HI and AVH. Pt stated that he has a drug/alcohol abuse problem. Pt presented with pleasant mood and affect. He was oriented x 4 and didn't appear to be responding to any internal stimuli.   Diagnosis: 295.70 Schizoaffective disorder, Depressive type  Past Medical History:  Past Medical History  Diagnosis Date  . Hep C w/o coma, chronic (HCC)   . Glaucoma   . Hypertension     History reviewed. No pertinent past surgical history.  Family History: History reviewed. No pertinent family history.  Social History:  reports that he has never smoked. He does not have any smokeless tobacco history on file. He reports that he drinks alcohol. He reports that he uses illicit drugs (Marijuana and Cocaine).  Additional Social History:  Alcohol / Drug Use Pain Medications: see MAR Prescriptions: see MAR Over the Counter: see MAR History of alcohol / drug use?: Yes Longest period of sobriety (when/how long): unknown Substance #1 Name of Substance 1: alcohol 1 - Age of First Use: 16 1 - Amount (size/oz): 3-4 40oz beers 1 - Frequency: daily 1 - Duration: several years 1 - Last Use / Amount: this am Substance #2 Name of Substance 2: powder cocaine 2 - Age of First Use: 22 2 - Amount (size/oz): 1 gram 2 - Frequency: daily 2 - Duration: several months 2 - Last Use / Amount: a week ago Substance #3 Name of Substance 3: THC 3 - Age of First Use: unknown 3 - Amount (size/oz):  unknown 3 - Frequency: every now and then 3 - Duration: ongoing 3 - Last Use / Amount: unknown  CIWA: CIWA-Ar BP: 167/99 mmHg Pulse Rate: 100 COWS:    PATIENT STRENGTHS: (choose at least two) Average or above average intelligence Capable of independent living  Allergies:  Allergies  Allergen Reactions  . Penicillins Itching and Rash    Home Medications:  (Not in a hospital admission)  OB/GYN Status:  No LMP for male patient.  General Assessment Data [facility] Assessment: In system Is this a Tele or Face-to-Face Assessment?: Tele Assessment Is this an Initial Assessment or a Re-assessment for this encounter?: Initial Assessment Marital status: Single Is patient pregnant?: No Pregnancy Status: No Living Arrangements: Alone Can pt return to current living arrangement?: Yes Admission Status: Voluntary Is patient capable of signing voluntary admission?: Yes Referral Source: Self/Family/Friend Insurance type: Medicare  Medical Screening Exam [facility] Walk[facility] ONLY) Medical Exam completed: Yes  Crisis Care Plan Living Arrangements: Alone Name of Psychiatrist: none Name of Therapist: none  Education Status Is patient currently in school?: No  Risk to self with the past 6 months Suicidal Ideation: Yes-Currently Present Has patient been a risk to self within the past 6 months prior to admission? : Yes Suicidal Intent: Yes-Currently Present Has patient had any suicidal intent within the past 6 months prior to admission? : Yes Is patient at risk for  suicide?: Yes Suicidal Plan?: Yes-Currently Present Has patient had any suicidal plan within the past 6 months prior to admission? : Yes Specify Current Suicidal Plan: stepping in front of a moving car Access to Means: Yes Specify Access to Suicidal Means: can walk What has been your use of drugs/alcohol within the last 12 months?: see above Previous Attempts/Gestures: No Triggers for Past Attempts:  Hallucinations Family Suicide History: No Recent stressful life event(s): Other (Comment) (hallucinations due to being non med-compliant) Persecutory voices/beliefs?: No Depression: Yes Depression Symptoms: Feeling angry/irritable Substance abuse history and/or treatment for substance abuse?: Yes Suicide prevention information given to non-admitted patients: Not applicable  Risk to Others within the past 6 months Homicidal Ideation: No Does patient have any lifetime risk of violence toward others beyond the six months prior to admission? : No Thoughts of Harm to Others: No Current Homicidal Intent: No Current Homicidal Plan: No Access to Homicidal Means: No History of harm to others?: No Assessment of Violence: None Noted Does patient have access to weapons?: No Criminal Charges Pending?: No Does patient have a court date: No Is patient on probation?: No  Psychosis Hallucinations: Auditory Delusions: None noted  Mental Status Report Appearance/Hygiene: Unremarkable Eye Contact: Fair Motor Activity: Unremarkable Speech: Logical/coherent Level of Consciousness: Alert Mood: Depressed Affect: Appropriate to circumstance, Depressed, Sad Anxiety Level: Minimal Thought Processes: Coherent, Relevant Judgement: Unimpaired Orientation: Person, Place, Time, Situation Obsessive Compulsive Thoughts/Behaviors: None  Cognitive Functioning Concentration: Normal Memory: Recent Intact, Remote Intact IQ: Average Insight: Fair Impulse Control: Fair Appetite: Poor Weight Loss: 13 Sleep: Decreased Total Hours of Sleep: 4 Vegetative Symptoms: None  ADLScreening [facility]) Patient's cognitive ability adequate to safely complete daily activities?: Yes Patient able to express need for assistance with ADLs?: Yes Independently performs ADLs?: Yes (appropriate for developmental age)  Prior Inpatient Therapy Prior Inpatient Therapy: Yes Prior Therapy Dates: Aug 2016,  2011, 2010 Prior Therapy Facilty/Provider(s): [facility]; [address] [place] Reason for Treatment: depression, SI, schizophrenia  Prior Outpatient Therapy Prior Outpatient Therapy: No Does patient have an ACCT team?: No Does patient have Intensive In-House Services?  : No Does patient have [facility] services? : No Does patient have P4CC services?: No  ADL Screening (condition at time of admission) Patient's cognitive ability adequate to safely complete daily activities?: Yes Patient able to express need for assistance with ADLs?: Yes Independently performs ADLs?: Yes (appropriate for developmental age)       Abuse/Neglect Assessment (Assessment to be complete while patient is alone) Physical Abuse: Denies Verbal Abuse: Denies Sexual Abuse: Denies Exploitation of patient/patient's resources: Denies Self-Neglect: Denies Values / Beliefs Cultural Requests During Hospitalization: None Spiritual Requests During Hospitalization: None Consults Spiritual Care Consult Needed: No Social Work Consult Needed: No Merchant navy officer (For Healthcare) Does patient have an advance directive?: No Would patient like information on creating an advanced directive?: No - patient declined information    Additional Information 1:1 In Past 12 Months?: No CIRT Risk: No Elopement Risk: No Does patient have medical clearance?: Yes     Disposition:  Disposition Initial Assessment Completed for this Encounter: Yes Disposition of Patient: Inpatient treatment program (per Dr. Geoffery Lyons) Type of inpatient treatment program: Adult (no appropriate bed at [facility]; TTS to seek placement)  Laddie Aquas 06/07/2015 6:50 PM

## 2015-06-07 NOTE — ED Notes (Signed)
Pharmacy notified for missing eye drops

## 2015-06-07 NOTE — ED Notes (Signed)
Pts wallet given to security.

## 2015-06-07 NOTE — ED Notes (Signed)
Staffing notified for sitter 

## 2015-06-07 NOTE — ED Notes (Signed)
Provided patient with Malawiturkey sandwich and Sprite per request.

## 2015-06-07 NOTE — ED Notes (Signed)
Pt [facility] with c/o centralized chest pain beginning at 6AM.  Pt reports using cocaine and heroin last night.  Reports paina sharp radiating to neck, worse with exertion.  Hx: susbstance abuse, mental health disorder, htn, reports being off his medications x 4 months.  EKG-LVH.  BP-114/82 HR-100 R-16 O2-94%RA.  324 ASA and 2 NTG given by EMS, pt decreased from 8/10 to 6/10.  Pt a x 4, NAD.

## 2015-06-07 NOTE — ED Notes (Signed)
PA at bedside.

## 2015-06-08 DIAGNOSIS — F1410 Cocaine abuse, uncomplicated: Secondary | ICD-10-CM | POA: Diagnosis not present

## 2015-06-08 LAB — I-STAT TROPONIN, ED: Troponin i, poc: 0 ng/mL (ref 0.00–0.08)

## 2015-06-08 NOTE — Progress Notes (Signed)
Seeking inpatient psychiatric tx for pt. [facility]- per [facility]- [facility] [place]- per [facility] (for geriatric unit)  Declined: [facility]  Both due to SA  [facility]  Ilean SkillMeghan Mariellen Blaney, MSW, LCSW Clinical Social Work, Disposition  06/08/2015 [phone]

## 2015-06-08 NOTE — Progress Notes (Signed)
Requested to see patient as he was reported to now be denying suicidal ideation.  Tyler Farrell is a 64 year old male who presented voluntarily reporting suicidal ideation and auditory hallucinations and was recommended for inpatient treatment. [facility] and [facility] but did not follow up. He was not compliant with Campral and lexapro but did not continue after discharge. Tyler Farrell reports that he relapsed two days after [facility]. The patient today is continuing to endorse suicidal ideation with plan to walk into traffic stating "I'm still not right in the head. I have a history of mental illness. I need treatment. I am not ready to leave here. I would like to get on medications. And I need help for my drug problem." Will continue to recommend inpatient treatment for substance abuse referral and medication management.

## 2015-06-08 NOTE — Progress Notes (Signed)
Pt re-evaluated by telepsychiatry today- continues to endorse SI and NP recommends inpatient admission.  Per Asher MuirJamie, admitting NP at [facility] [place]- pt is accepted for admission to acute geriatric unit by Dr. Connye BurkittStephen Revell. Number for report is [phone] ask for acute adult unit to be transferred to admitting RN for report. Facility address: [address], [place], [place] [address]  Ilean SkillMeghan Kitty Cadavid, MSW, LCSW Clinical Social Work, Disposition  06/08/2015 [phone]

## 2016-01-11 ENCOUNTER — Emergency Department (HOSPITAL_BASED_OUTPATIENT_CLINIC_OR_DEPARTMENT_OTHER): Payer: Medicare Other

## 2016-01-11 ENCOUNTER — Encounter (HOSPITAL_BASED_OUTPATIENT_CLINIC_OR_DEPARTMENT_OTHER): Payer: Self-pay

## 2016-01-11 ENCOUNTER — Emergency Department (HOSPITAL_BASED_OUTPATIENT_CLINIC_OR_DEPARTMENT_OTHER)
Admission: EM | Admit: 2016-01-11 | Discharge: 2016-01-11 | Disposition: A | Discharge status: Home / Self Care | Payer: Medicare Other | Attending: Emergency Medicine | Admitting: Emergency Medicine

## 2016-01-11 DIAGNOSIS — R9431 Abnormal electrocardiogram [ECG] [EKG]: Secondary | ICD-10-CM | POA: Insufficient documentation

## 2016-01-11 DIAGNOSIS — I10 Essential (primary) hypertension: Secondary | ICD-10-CM | POA: Insufficient documentation

## 2016-01-11 HISTORY — DX: Essential (primary) hypertension: I10

## 2016-01-11 HISTORY — DX: Other chronic pain: G89.29

## 2016-01-11 HISTORY — DX: Unspecified glaucoma: H40.9

## 2016-01-11 LAB — CBC WITH DIFF
BASOPHIL #: 0.1 x10ˆ3/uL (ref 0.00–0.10)
BASOPHIL %: 2 % (ref 0–3)
EOSINOPHIL #: 0.2 x10ˆ3/uL (ref 0.00–0.50)
EOSINOPHIL %: 2 % (ref 0–5)
HCT: 43.8 % (ref 40.0–50.0)
HGB: 14.2 g/dL (ref 13.5–18.0)
LYMPHOCYTE #: 2.2 x10ˆ3/uL (ref 1.00–4.80)
LYMPHOCYTE %: 26 % (ref 15–43)
MCH: 31.5 pg (ref 27.5–33.2)
MCHC: 32.5 g/dL (ref 32.0–36.0)
MCV: 96.8 fL (ref 82.0–97.0)
MONOCYTE #: 0.6 x10ˆ3/uL (ref 0.20–0.90)
MONOCYTE %: 8 % (ref 5–12)
MPV: 7.9 fL (ref 7.4–10.5)
NEUTROPHIL #: 5.1 x10ˆ3/uL (ref 1.50–6.50)
NEUTROPHIL %: 62 % (ref 43–76)
PLATELETS: 177 x10?3/uL (ref 150–450)
PLATELETS: 177 x10ˆ3/uL (ref 150–450)
RBC: 4.53 x10ˆ6/uL (ref 4.40–5.80)
RDW: 14 % (ref 11.0–16.0)
WBC: 8.2 x10ˆ3/uL (ref 4.0–11.0)

## 2016-01-11 LAB — COMPREHENSIVE METABOLIC PROFILE - BMC/JMC ONLY
ALBUMIN/GLOBULIN RATIO: 1.1 (ref 0.8–2.0)
ALBUMIN: 3.7 g/dL (ref 3.5–5.0)
ALKALINE PHOSPHATASE: 72 U/L (ref 38–126)
ALT (SGPT): 27 U/L (ref 17–63)
ANION GAP: 6 mmol/L (ref 3–11)
AST (SGOT): 26 U/L (ref 15–41)
BILIRUBIN TOTAL: 0.8 mg/dL (ref 0.3–1.2)
BUN/CREA RATIO: 11 (ref 6–22)
BUN: 11 mg/dL (ref 6–20)
CALCIUM: 9.5 mg/dL (ref 8.8–10.2)
CHLORIDE: 102 mmol/L (ref 101–111)
CO2 TOTAL: 27 mmol/L (ref 22–32)
CREATININE: 1.01 mg/dL (ref 0.61–1.24)
ESTIMATED GFR: >60 mL/min/1.73mˆ2 (ref >60)
GLUCOSE: 96 mg/dL (ref 70–110)
POTASSIUM: 4 mmol/L (ref 3.4–5.1)
PROTEIN TOTAL: 7 g/dL (ref 6.4–8.3)
SODIUM: 135 mmol/L — ABNORMAL LOW (ref 136–145)

## 2016-01-11 LAB — TROPONIN-I: TROPONIN I: 0.04 ng/mL (ref <=0.06)

## 2016-01-11 MED ORDER — AMLODIPINE 10 MG TABLET
10.0000 mg | ORAL_TABLET | Freq: Every day | ORAL | 1 refills | Status: AC
Start: 2016-01-11 — End: ?

## 2016-01-11 MED ORDER — ENALAPRILAT 1.25 MG/ML INTRAVENOUS SOLUTION
1.25 mg | INTRAVENOUS | Status: AC
Start: 2016-01-11 — End: 2016-01-11
  Administered 2016-01-11: 1.25 mg via INTRAVENOUS
  Filled 2016-01-11: qty 1

## 2016-01-11 NOTE — ED Nurses Note (Signed)
Patient discharged home with family.  AVS reviewed with patient/care giver.  A written copy of the AVS and discharge instructions was given to the patient/care giver.  Questions sufficiently answered as needed.  Patient/care giver encouraged to follow up with PCP as indicated.  In the event of an emergency, patient/care giver instructed to call 911 or go to the nearest emergency room.      Current Discharge Medication List      START taking these medications.       Details    amLODIPine 10 mg Tablet   Commonly known as:  NORVASC    10 mg, Oral, Daily   Qty:  30 Tab   Refills:  1         Educated pt on the importance of followup w/ the cardiologist.

## 2016-01-11 NOTE — ED Triage Notes (Signed)
Pt reports elevated BP and out of meds x 1 month due to move from [place]

## 2016-01-11 NOTE — ED Provider Notes (Signed)
[facility]  Emergency Department Visit Note    Date:  01/11/2016  Primary care provider:  None Given  Means of arrival:  private car  History obtained from: patient  History limited by: none    Chief Complaint:  Hypertension    HISTORY OF PRESENT ILLNESS     Jose Daniels, date of birth 10-21-1950, is a 65 y.o. male who presents to the Emergency Department with hypertension. Patient reports he was at the rescue mission today (01/11/16) and had his blood pressure checked, which was elevated. He was advised to come here to the ED for evaluation. He adds that he has recently moved here from [place] and has not had a primary care provider established yet. He denies chest pain, shortness of breath, nausea, vomiting, or fevers. "I am only here because the machine showed a bad number and I need my medication."     REVIEW OF SYSTEMS     The pertinent positive and negative symptoms are as per HPI. All other systems reviewed and are negative.     PATIENT HISTORY     Past Medical History:  Past Medical History:   Diagnosis Date    Chronic back pain     Glaucoma     HTN (hypertension)      Past Surgical History:  Past Surgical History:   Procedure Laterality Date    Hx no surgical procedures       Family History:  No history of acute family illness given at this time.     Social History:  Social History   Substance Use Topics    Smoking status: Never Smoker    Smokeless tobacco: None    Alcohol use Yes     History   Drug Use    Yes    Special: Cocaine     Medications:  No outpatient prescriptions have been marked as taking for the 01/11/16 encounter [facility] Encounter).     Allergies:  Allergies   Allergen Reactions    Penicillins        PHYSICAL EXAM     Vitals:  Filed Vitals:    01/11/16 1319   BP: (!) 172/115   Pulse: 77   Resp: 16   Temp: 37.2 C (98.9 F)   SpO2: 97%     Pulse ox  97% on None (Room Air) interpreted by me as: Normal    Constitutional: The patient is Alert and oriented to  person, place and time.  Non-toxic and non-ill appearing.  The patient is in no distress and is resting comfortably in the gurney.  Eyes: Pupils equal and round, reactive to light. There is normal, painless extraocular muscle motion. Fundoscopic exam is normal.   ENT: Atraumatic. Normocephalic head. Mucous membranes moist. Posterior oropharynx is unremarkable. Trachea is midline without stridor  Lungs: Clear to auscultation bilaterally. Normal inspiratory:expiratory ratio. No respiratory distress.  Cardiovascular: Heart is S1-S2 regular rate and rhythm without murmur, click, gallop or rub.  Abdomen: Soft. Non-tender. Non-distended. No evidence of rebound or guarding.  Extremities: No acute tenderness to palpation. No deformity. No abnormality of range of motion.  Skin: No cyanosis, jaundice, rash or lesion.  Neurologic: Normal facial symmetry and speech. 5/5 upper and lower extremity strength. Normal ambulatory ability.  Vascular: Normal peripheral pulses with brisk capillary refill of less than 2 seconds.  Psychiatric: Normal affect. Normal insight. No evidence of psychosis.    DIAGNOSTIC STUDIES     Labs:  Results for orders placed or performed during the hospital encounter of 01/11/16   COMPREHENSIVE METABOLIC PROFILE - [facility] ONLY   Result Value Ref Range    SODIUM 135 (L) 136 - 145 mmol/L    POTASSIUM 4.0 3.4 - 5.1 mmol/L    CHLORIDE 102 101 - 111 mmol/L    CO2 TOTAL 27 22 - 32 mmol/L    ANION GAP 6 3 - 11 mmol/L    BUN 11 6 - 20 mg/dL    CREATININE 1.61 0.96 - 1.24 mg/dL    BUN/CREA RATIO 11 6 - 22    ESTIMATED GFR >60 >60 mL/min/1.38m2    ALBUMIN 3.7 3.5 - 5.0 g/dL    CALCIUM 9.5 8.8 - 04.5 mg/dL    GLUCOSE 96 70 - 409 mg/dL    ALKALINE PHOSPHATASE 72 38 - 126 U/L    ALT (SGPT) 27 17 - 63 U/L    AST (SGOT) 26 15 - 41 U/L    BILIRUBIN TOTAL 0.8 0.3 - 1.2 mg/dL    PROTEIN TOTAL 7.0 6.4 - 8.3 g/dL    ALBUMIN/GLOBULIN RATIO 1.1 0.8 - 2.0   TROPONIN-I   Result Value Ref Range    TROPONIN I 0.04 <=0.06 ng/mL    CBC WITH DIFF   Result Value Ref Range    WBC 8.2 4.0 - 11.0 x103/uL    RBC 4.53 4.40 - 5.80 x106/uL    HGB 14.2 13.5 - 18.0 g/dL    HCT 81.1 91.4 - 78.2 %    MCV 96.8 82.0 - 97.0 fL    MCH 31.5 27.5 - 33.2 pg    MCHC 32.5 32.0 - 36.0 g/dL    RDW 95.6 21.3 - 08.6 %    PLATELETS 177 150 - 450 x103/uL    MPV 7.9 7.4 - 10.5 fL    NEUTROPHIL % 62 43 - 76 %    LYMPHOCYTE % 26 15 - 43 %    MONOCYTE % 8 5 - 12 %    EOSINOPHIL % 2 0 - 5 %    BASOPHIL % 2 0 - 3 %    NEUTROPHIL # 5.10 1.50 - 6.50 x103/uL    LYMPHOCYTE # 2.20 1.00 - 4.80 x103/uL    MONOCYTE # 0.60 0.20 - 0.90 x103/uL    EOSINOPHIL # 0.20 0.00 - 0.50 x103/uL    BASOPHIL # 0.10 0.00 - 0.10 x103/uL     Labs reviewed and interpreted by me.    Radiology:    XR CHEST AP PORTABLE: No infiltrates. Significant elevation of the left hemidiaphragm. Diaphragmatic paralysis sestamibi considered  Radiological imaging interpreted by radiologist and independently reviewed by me.    EKG:  12 lead EKG interpreted by me shows normal sinus rhythm, rate of 66 bpm, voltage criteria for LVH, ST segment abnormality in V2 suggestive Wellen's sign, anterior T-wave inversion without anginal equivalency.     ED PROGRESS NOTE / MEDICAL DECISION MAKING     Old records reviewed by me:  I have reviewed the nurse's notes. I have reviewed the patient's problem list.      Orders Placed This Encounter    XR CHEST AP PORTABLE    COMPREHENSIVE METABOLIC PROFILE - [facility] ONLY    TROPONIN-I    CBC WITH DIFF    OXYGEN - NASAL CANNULA    ECG 12-Lead    INSERT & MAINTAIN PERIPHERAL IV ACCESS    enalaprilat (VASOTEC) 1.25 mg/mL injection     ECG ordered prior  to evaluation.     1456: Initial evaluation is complete at this time. He notes that he takes enalaprilat. I discussed with the patient that I would treat with Vasotec to improve his blood pressure. Will also order labs and a chest x-ray to further evaluate. Patient is agreeable with the treatment plan at this time. I have  screened the patient for tobacco use and the patient is a tobacco non-user.     Review of the patient's EKG reveals biphasic T wave in V2 with LVH. This is concerning for Wellen's sign. I discussed the relevance and my concern with the patient who contends he is "just here for a refill on his medication." He claims that "some ER doctor in [place] on my way up here told me the same thing."  He has no dyspnea or chest pain. There is no indication of anginal equivalent. Patient is to follow up with Dr. Lanell Matar (Cardiology) as soon as possible for [facility] sign on EKG.    1717: Blood pressures continue to be elevated after treatment with Vasotec. I will have the nurse perform a manual blood pressure.     1730: On recheck, the patient is doing well here. He is not having current chest pain. His blood pressure has improved to 167/97. I explained the results of the diagnostic studies. Patient will be discharged with Norvasc.  I discussed the diagnosis, disposition, and follow-up plan. The patient understood and is in accordance with the treatment plan at this time. Patient is to return here to the Emergency Department if new or worsening symptoms appear. All of their questions have been answered to their satisfaction. The patient is in stable condition at the time of discharge.     Pre-Disposition Vitals:  Filed Vitals:    01/11/16 1319 01/11/16 1645 01/11/16 1700   BP: (!) 172/115 (!) 176/126 (!) 166/121   Pulse: 77 70 72   Resp: 16 16 17    Temp: 37.2 C (98.9 F)     SpO2: 97% 98% 99%     CLINICAL IMPRESSION     1. Hypertension - improved  2. Abnormal EKG - Wellen's sign    DISPOSITION/PLAN     Discharged        Prescriptions:   New Prescriptions    AMLODIPINE (NORVASC) 10 MG ORAL TABLET    Take 1 Tab (10 mg total) by mouth Once a day     Follow-Up:     Damien Fusi, MD  [address]  [address]  [place] [address]  [phone]    Schedule an appointment as soon as possible for a visit  as soon as possible for  discussion of your abnormal EKG (Wellen's sign)    Condition at Disposition: Stable      SCRIBE ATTESTATION STATEMENT  I Corey Harold, SCRIBE scribed for Marianna Fuss, DO on 01/11/2016 at 2:54 PM.     Documentation assistance provided for Marianna Fuss, DO  by Corey Harold, SCRIBE. Information recorded by the scribe was done at my direction and has been reviewed and validated by me Marianna Fuss, DO.

## 2016-01-11 NOTE — Discharge Instructions (Signed)
Established High Blood Pressure    High blood pressure (hypertension) is a chronic disease. Often, healthcare providers don’t know what causes it. But it can be caused by certain health conditions and medicines.  If you have high blood pressure, you may not have any symptoms. If you do have symptoms, they may include headache, dizziness, changes in your vision, chest pain, and shortness of breath. But even without symptoms, high blood pressure that’s not treated raises your risk for heart attack and stroke. High blood pressure is a serious health risk and shouldn’t be ignored.  A blood pressure reading is made up of two numbers: a higher number over a lower number. The top number is the systolic pressure. The bottom number is the diastolic pressure. A normal blood pressure is a systolic pressure of  less than 120 over a diastolic pressure of less than 80. You will see your blood pressure readings written together. For example, a person with a systolic pressure of 188 and a diastolic pressure of 78 will have 118/78 written in the medical record.  High blood pressure is when either the top number is 140 or higher, or the bottom number is 90 or higher. This must be the result when taking your blood pressure a number of times. The blood pressures between normal and high are called prehypertension.  Home care  If you have high blood pressure, you should do what is listed below to lower your blood pressure. If you are taking medicines for high blood pressure, these methods may reduce or end your need for medicines in the future.  · Begin a weight-loss program if you are overweight.  · Cut back on how much salt you get in your diet. Here’s how to do this:  ¨ Don’t eat foods that have a lot of salt. These include olives, pickles, smoked meats, and salted potato chips.  ¨ Don’t add salt to your food at the table.  ¨ Use only small amounts of salt when cooking.  · Start an exercise program. Talk with your healthcare  provider about the type of exercise program that would be Jevante Hollibaugh for you. It doesn't have to be hard. Even brisk walking for 20 minutes 3 times a week is a good form of exercise.  · Don’t take medicines that stimulate the heart. This includes many over-the-counter cold and sinus decongestant pills and sprays, as well as diet pills. Check the warnings about hypertension on the label. Before buying any over-the-counter medicines or supplements, always ask the pharmacist about the product's potential interaction with your high blood pressure and your high blood pressure medicines.  · Stimulants such as amphetamine or cocaine could be deadly for someone with high blood pressure. Never take these.  · Limit how much caffeine you get in your diet. Switch to caffeine-free products.  · Stop smoking. If you are a long-time smoker, this can be hard. Talk to your healthcare provider about medicines and nicotine replacement options to help you. Also, enroll in a stop-smoking program to make it more likely that you will quit for good.  · Learn how to handle stress. This is an important part of any program to lower blood pressure. Learn about relaxation methods like meditation, yoga, or biofeedback.  · If your provider prescribed medicines, take them exactly as directed. Missing doses may cause your blood pressure get out of control.  · If you miss a dose or doses, check with your healthcare provider or pharmacist about what to do.  · Consider buying   an automatic blood pressure machine. Ask your provider for a recommendation. You can get one of these at most pharmacies.    The American Heart Association recommends the following guidelines for home blood pressure monitoring:   Don't smoke or drink coffee for 30 minutes before taking your blood pressure.   Go to the bathroom before the test.   Relax for 5 minutes before taking the measurement.   Sit with your back supported (don't sit on a couch or soft chair); keep your feet on  the floor uncrossed. Place your arm on a solid flat surface (like a table) with the upper part of the arm at heart level. Place the middle of the cuff directly above the eye of the elbow. Check the monitor's instruction manual for an illustration.   Take multiple readings. When you measure, take 2 to 3 readings one minute apart and record all of the results.   Take your blood pressure at the same time every day, or as your healthcare provider recommends.   Record the date, time, and blood pressure reading.   Take the record with you to your next medical appointment. If your blood pressure monitor has a built-in memory, simply take the monitor with you to your next appointment.   Call your provider if you have several high readings. Don't be frightened by a single high blood pressure reading, but if you get several high readings, check in with your healthcare provider.   Note: When blood pressure reaches a systolic (top number) of 180 or higher OR diastolic (bottom number) of 110 or higher, seek emergency medical treatment.  Follow-up care  You will need to see your healthcare provider regularly. This is to check your blood pressure and to make changes to your medicines. Make a follow-up appointment as directed. Bring the record of your home blood pressure readings to the appointment.  When to seek medical advice  Call your healthcare provider right away if any of these occur:   Blood pressure reaches a systolic (upper number) of 180 or higher OR a diastolic (bottom number) of 110 or higher   Chest pain or shortness of breath   Severe headache   Throbbing or rushing sound in the ears   Nosebleed   Sudden severe pain in your belly (abdomen)   Extreme drowsiness, confusion, or fainting   Dizziness or spinning sensation (vertigo)   Weakness of an arm or leg or one side of the face   You have problems speaking or seeing  Date Last Reviewed: 05/11/2015   2000-2017 [employer] [address], [place], [place] [address]. All rights reserved. This information is not intended as a substitute for professional medical care. Always follow your healthcare professional's instructions.    Your EKG is abnormal and requires prompt follow-up with a cardiologist. The finding on your EKG is highly suggestive a a potential blockage in the arteries to your heart. You are not having a heart attack now, but may in the future. Follow-u is imperative as treatment may be possible to prevent a heart attack.

## 2016-01-12 LAB — ECG 12-LEAD
Atrial Rate: 66 {beats}/min
Calculated P Axis: 63 degrees
Calculated R Axis: 26 degrees
Calculated T Axis: 121 degrees
PR Interval: 170 ms
QRS Duration: 86 ms
QT Interval: 396 ms
QTC Calculation: 415 ms
Ventricular rate: 66 {beats}/min

## 2016-01-18 ENCOUNTER — Encounter (HOSPITAL_BASED_OUTPATIENT_CLINIC_OR_DEPARTMENT_OTHER): Payer: Self-pay

## 2016-01-18 ENCOUNTER — Emergency Department (HOSPITAL_BASED_OUTPATIENT_CLINIC_OR_DEPARTMENT_OTHER)
Admission: EM | Admit: 2016-01-18 | Discharge: 2016-01-18 | Disposition: A | Discharge status: Other Acute Inpatient Hospital | Payer: Medicare Other | Attending: Emergency Medicine | Admitting: Emergency Medicine

## 2016-01-18 DIAGNOSIS — F259 Schizoaffective disorder, unspecified: Secondary | ICD-10-CM | POA: Insufficient documentation

## 2016-01-18 DIAGNOSIS — F1020 Alcohol dependence, uncomplicated: Secondary | ICD-10-CM | POA: Insufficient documentation

## 2016-01-18 DIAGNOSIS — I10 Essential (primary) hypertension: Secondary | ICD-10-CM | POA: Insufficient documentation

## 2016-01-18 HISTORY — DX: Schizoaffective disorder, unspecified (CMS HCC): F25.9

## 2016-01-18 HISTORY — DX: Bipolar disorder, current episode depressed, mild or moderate severity, unspecified (CMS HCC): F31.30

## 2016-01-18 HISTORY — DX: Chronic viral hepatitis C (CMS HCC): B18.2

## 2016-01-18 LAB — URINALYSIS WITH MICROSCOPIC REFLEX IF INDICATED BMC/JMC ONLY
BILIRUBIN: NEGATIVE mg/dL
GLUCOSE: NEGATIVE mg/dL
KETONES: NEGATIVE mg/dL
LEUKOCYTES: NEGATIVE WBCs/uL
NITRITE: NEGATIVE
PH: 5 (ref <8.0)
PROTEIN: NEGATIVE mg/dL
SPECIFIC GRAVITY: 1.019 (ref <1.022)
UROBILINOGEN: 2 mg/dL (ref <=2.0)

## 2016-01-18 LAB — COMPREHENSIVE METABOLIC PROFILE - BMC/JMC ONLY
ALBUMIN/GLOBULIN RATIO: 1.1 (ref 0.8–2.0)
ALBUMIN: 3.9 g/dL (ref 3.5–5.0)
ALBUMIN: 3.9 g/dL (ref 3.5–5.0)
ALKALINE PHOSPHATASE: 87 U/L (ref 38–126)
ALT (SGPT): 41 U/L (ref 17–63)
ANION GAP: 6 mmol/L (ref 3–11)
AST (SGOT): 44 U/L — ABNORMAL HIGH (ref 15–41)
BILIRUBIN TOTAL: 0.5 mg/dL (ref 0.3–1.2)
BUN/CREA RATIO: 9 (ref 6–22)
BUN: 9 mg/dL (ref 6–20)
CALCIUM: 9.4 mg/dL (ref 8.8–10.2)
CHLORIDE: 102 mmol/L (ref 101–111)
CO2 TOTAL: 28 mmol/L (ref 22–32)
CREATININE: 0.99 mg/dL (ref 0.61–1.24)
ESTIMATED GFR: >60 mL/min/{1.73_m2} (ref >60)
GLUCOSE: 83 mg/dL (ref 70–110)
POTASSIUM: 3.6 mmol/L (ref 3.4–5.1)
PROTEIN TOTAL: 7.5 g/dL (ref 6.4–8.3)
SODIUM: 136 mmol/L (ref 136–145)

## 2016-01-18 LAB — CBC WITH DIFF
BASOPHIL #: 0.1 x10ˆ3/uL (ref 0.00–0.10)
BASOPHIL %: 1 % (ref 0–3)
EOSINOPHIL #: 0.1 10*3/uL (ref 0.00–0.50)
EOSINOPHIL %: 3 % (ref 0–5)
HCT: 45.9 % (ref 40.0–50.0)
HGB: 15.3 g/dL (ref 13.5–18.0)
LYMPHOCYTE #: 1.7 x10ˆ3/uL (ref 1.00–4.80)
LYMPHOCYTE %: 36 % (ref 15–43)
MCH: 32.1 pg (ref 27.5–33.2)
MCHC: 33.3 g/dL (ref 32.0–36.0)
MCV: 96.5 fL (ref 82.0–97.0)
MONOCYTE #: 0.7 x10ˆ3/uL (ref 0.20–0.90)
MONOCYTE %: 16 % — ABNORMAL HIGH (ref 5–12)
MPV: 8.4 fL (ref 7.4–10.5)
NEUTROPHIL #: 2.1 x10ˆ3/uL (ref 1.50–6.50)
NEUTROPHIL %: 45 % (ref 43–76)
PLATELETS: 187 x10ˆ3/uL (ref 150–450)
RBC: 4.76 x10ˆ6/uL (ref 4.40–5.80)
RDW: 13.6 % (ref 11.0–16.0)
WBC: 4.8 10*3/uL (ref 4.0–11.0)
WBC: 4.8 x10ˆ3/uL (ref 4.0–11.0)

## 2016-01-18 LAB — DRUG SCREEN,URINE - BMC/JMC ONLY
AMPHETAMINES URINE: NEGATIVE
BARBITURATES URINE: NEGATIVE
BENZODIAZEPINES URINE: NEGATIVE
CANNABINOIDS URINE: NEGATIVE
COCAINE METABOLITES URINE: POSITIVE — AB
METHADONE URINE: NEGATIVE
OPIATES URINE: NEGATIVE
OXYCODONE URINE: NEGATIVE
PCP URINE: NEGATIVE
PCP URINE: NEGATIVE
TRICYCLIC ANTIDEPRESSANTS URINE: NEGATIVE

## 2016-01-18 LAB — GREEN TUBE

## 2016-01-18 LAB — ETHANOL, SERUM
ETHANOL: <10 mg/dL (ref <10)
ETHANOL: NOT DETECTED

## 2016-01-18 LAB — URINALYSIS, MICROSCOPIC

## 2016-01-18 LAB — BLUE TOP TUBE

## 2016-01-18 LAB — RED TOP TUBE

## 2016-01-18 LAB — THYROID STIMULATING HORMONE WITH FREE T4 REFLEX: TSH: 1.061 u[IU]/mL (ref 0.340–5.330)

## 2016-01-18 NOTE — Consults (Signed)
Consult completed. Please see full behavioral health note.

## 2016-01-18 NOTE — ED Triage Notes (Signed)
Pt states that he is here because he want to get into rehab for drinking.  Pt in NAD at this time.  Pt states he drinks a 12 pk of beer a day and the last drink was 2 am this morning.

## 2016-01-18 NOTE — ED Provider Notes (Incomplete)
[name], MD  [facility]  Emergency Department Visit Note    Date:  01/18/2016  Primary care provider:  None Given  Means of arrival:  private car  History obtained from: patient  History limited by: none    Chief Complaint:  Outpatient Substance Abuse Treatment Referral    HISTORY OF PRESENT ILLNESS     Jose Daniels, date of birth 12-09-1950, is a 65 y.o. male who presents to the Emergency Department for outpatient substance abuse treatment referral. Patient reports he has been drinking for four days (01/14/16), last drank at 0200 today (01/18/16). He indicates usually drinking a 12 pack of beer a day. He admits he has also been shooting cocaine, last done a week ago. He admits he also smokes cocaine, last done yesterday (01/17/16). He notes  He is going through withdrawal, is having confusion, which he admits occurs when he is not using cocaine or alcohol. He affirms having nausea. Denies current suicidal or homicidal ideations. Patient does not report any alleviating or aggravating factors.    He has a history of hepatitis C.     REVIEW OF SYSTEMS     The pertinent positive and negative symptoms are as per HPI. All other systems reviewed and are negative.     PATIENT HISTORY     Past Medical History:  Past Medical History:   Diagnosis Date    Bipolar affect, depressed (HCC)     Chronic back pain     Glaucoma     Hep C w/ coma, chronic (HCC)     HTN (hypertension)     Schizoaffective disorder (HCC)        Past Surgical History:  Past Surgical History:   Procedure Laterality Date    Hx no surgical procedures         Family History:  No history of acute family illness given at this time.       Social History:  Social History   Substance Use Topics    Smoking status: Never Smoker    Smokeless tobacco: None    Alcohol use Yes      Comment: 12 pk day     History   Drug Use    Yes    Special: Cocaine, Marijuana     Comment: cocaine 3 days ago marijuana a while       Medications:  Outpatient  Prescriptions Marked as Taking for the 01/18/16 encounter [facility] Encounter)   Medication Sig    amLODIPine (NORVASC) 10 mg Oral Tablet Take 1 Tab (10 mg total) by mouth Once a day       Allergies:  Allergies   Allergen Reactions    Penicillins        PHYSICAL EXAM     Vitals:  Filed Vitals:    01/18/16 1112   BP: (!) 151/99   Pulse: 74   Resp: 17   Temp: 36.5 C (97.7 F)   SpO2: 99%     Pulse ox  99% on None (Room Air) interpreted by me as: Normal    Constitutional: Appears well-developed and well-nourished. Does not appear to be in distress. Patient does not appear to be in extensive withdrawal.    HENT:   Head: Atraumatic.  Nose: No rhinorrhea.   Mouth/Throat: No posterior oropharyngeal erythema.   Eyes: EOM are normal. Pupils are equal, round, and reactive to light.   Neck: No JVD present. Carotid bruit is not present. Neck is supple with  full range of motion.  Cardiovascular: Normal rate and regular rhythm. No murmurs, rubs, or gallops.   Pulmonary/Chest: No accessory muscle usage. No respiratory distress. Lungs are clear to auscultation. No wheezes, rales, or rhonchi.  Abdominal: Bowel sounds are normal. There is no tenderness.   Lymphadenopathy:  No cervical adenopathy.   Extremities: Normal range of motion. No edema.  Neurological: CN intact.  No sensory or motor focal deficits.   Skin: No rash noted. No cyanosis.   Psychiatric: Normal mood and affect. Behavior is normal.  No SI or HI.     DIAGNOSTIC STUDIES     Labs:    Results for orders placed or performed during the hospital encounter of 01/18/16   COMPREHENSIVE METABOLIC PROFILE - [facility] ONLY   Result Value Ref Range    SODIUM 136 136 - 145 mmol/L    POTASSIUM 3.6 3.4 - 5.1 mmol/L    CHLORIDE 102 101 - 111 mmol/L    CO2 TOTAL 28 22 - 32 mmol/L    ANION GAP 6 3 - 11 mmol/L    BUN 9 6 - 20 mg/dL    CREATININE 1.61 0.96 - 1.24 mg/dL    BUN/CREA RATIO 9 6 - 22    ESTIMATED GFR >60 >60 mL/min/1.59m2    ALBUMIN 3.9 3.5 - 5.0 g/dL    CALCIUM 9.4 8.8 -  04.5 mg/dL    GLUCOSE 83 70 - 409 mg/dL    ALKALINE PHOSPHATASE 87 38 - 126 U/L    ALT (SGPT) 41 17 - 63 U/L    AST (SGOT) 44 (H) 15 - 41 U/L    BILIRUBIN TOTAL 0.5 0.3 - 1.2 mg/dL    PROTEIN TOTAL 7.5 6.4 - 8.3 g/dL    ALBUMIN/GLOBULIN RATIO 1.1 0.8 - 2.0   DRUG SCREEN,URINE,RAPID - [facility] ONLY   Result Value Ref Range    AMPHETAMINES URINE Negative Negative    BARBITURATES URINE Negative Negative    BENZODIAZEPINES URINE Negative Negative    PCP URINE Negative Negative    METHADONE URINE Negative Negative    OPIATES URINE Negative Negative    CANNABINOIDS URINE Negative Negative    COCAINE METABOLITES URINE Positive (A) Negative    OXYCODONE URINE Negative Negative    TRICYCLIC ANTIDEPRESSANTS URINE Negative Negative   ETHANOL, SERUM   Result Value Ref Range    ETHANOL <10 <10 mg/dL    ETHANOL Not Detected    THYROID STIMULATING HORMONE WITH FREE T4 REFLEX   Result Value Ref Range    TSH 1.061 0.340 - 5.330 uIU/mL   URINALYSIS WITH MICROSCOPIC REFLEX IF INDICATED [facility] ONLY   Result Value Ref Range    COLOR Yellow Light Yellow, Straw, Yellow    APPEARANCE Clear Clear    PH 5.0 <8.0    LEUKOCYTES Negative Negative WBCs/uL    NITRITE Negative Negative    PROTEIN Negative Negative mg/dL    GLUCOSE Negative Negative mg/dL    KETONES Negative Negative mg/dL    UROBILINOGEN 2.0  <=8.1 mg/dL    BILIRUBIN Negative Negative mg/dL    BLOOD Moderate (A) Negative mg/dL    SPECIFIC GRAVITY 1.914 <1.022   CBC WITH DIFF   Result Value Ref Range    WBC 4.8 4.0 - 11.0 x103/uL    RBC 4.76 4.40 - 5.80 x106/uL    HGB 15.3 13.5 - 18.0 g/dL    HCT 78.2 95.6 - 21.3 %    MCV 96.5 82.0 - 97.0 fL  MCH 32.1 27.5 - 33.2 pg    MCHC 33.3 32.0 - 36.0 g/dL    RDW 46.913.6 62.911.0 - 52.816.0 %    PLATELETS 187 150 - 450 x103/uL    MPV 8.4 7.4 - 10.5 fL    NEUTROPHIL % 45 43 - 76 %    LYMPHOCYTE % 36 15 - 43 %    MONOCYTE % 16 (H) 5 - 12 %    EOSINOPHIL % 3 0 - 5 %    BASOPHIL % 1 0 - 3 %    NEUTROPHIL # 2.10 1.50 - 6.50 x103/uL    LYMPHOCYTE # 1.70  1.00 - 4.80 x103/uL    MONOCYTE # 0.70 0.20 - 0.90 x103/uL    EOSINOPHIL # 0.10 0.00 - 0.50 x103/uL    BASOPHIL # 0.10 0.00 - 0.10 x103/uL   BLUE TOP TUBE   Result Value Ref Range    RAINBOW/EXTRA TUBE AUTO RESULT Yes    URINALYSIS, MICROSCOPIC   Result Value Ref Range    RBCS 30-50 (A) 0 - 2 /hpf    WBCS 0-2 0 - 2 /hpf    BACTERIA Slight (A) None /hpf    SQUAMOUS EPITHELIAL None 0 - 2 /hpf    MUCOUS Slight (A) None /hpf     Labs reviewed and interpreted by me.    ECG:   12 lead EKG interpreted by me shows *** rhythm, rate of *** bpm, ***.     ED PROGRESS NOTE / MEDICAL DECISION MAKING     Old records reviewed by me:  I have reviewed the nurse's notes. I have reviewed the patient's problem list.      Orders Placed This Encounter    CBC/DIFF    COMPREHENSIVE METABOLIC PROFILE - [facility] ONLY    DRUG SCREEN,URINE,RAPID - [facility] ONLY    ETHANOL, SERUM    THYROID STIMULATING HORMONE WITH FREE T4 REFLEX    URINALYSIS WITH MICROSCOPIC REFLEX IF INDICATED [facility] ONLY    RAINBOW DRAW - [facility] ONLY    CBC WITH DIFF    BLUE TOP TUBE    RED TOP TUBE    Green Tube    URINALYSIS, MICROSCOPIC    ECG 12-Lead     Labs ordered prior to evaluation.      1131: Initial evaluation is complete at this time. Will medically clear patient and have the crisis worker evaluate. I discussed with the patient that I would medically clear and have the crisis worker come to bedside to further evaluate. Patient is agreeable with the treatment plan at this time. I have screened the patient for tobacco use and the patient is a tobacco non-user.     Patient is medically cleared for psychiatric disposition.     1517: Per Harvin [employer] social research officer(Crisis Worker), patient has [facility] [place]. He has been accepted by Dr. Conley RollsBez (Psychiatry).     Waiting for patient transportation.     ***     Pre-Disposition Vitals:  Filed Vitals:    01/18/16 1245 01/18/16 1330 01/18/16 1515 01/18/16 1600   BP: (!) 140/102 (!) 151/101 (!) 154/102 (!) 147/106    Pulse:       Resp:       Temp:       SpO2: 96% 98% 100% 98%     CLINICAL IMPRESSION     Encounter Diagnosis   Name Primary?    Alcohol dependence (HCC) Yes   Alcohol dependence    DISPOSITION/PLAN  Transferred to Another Facility        Condition at Disposition: Stable       SCRIBE ATTESTATION STATEMENT  I Corey Harold, SCRIBE scribed for [name], MD on 01/18/2016 at 11:17 AM.     Documentation assistance provided for [name], MD  by Corey Harold, SCRIBE. Information recorded by the scribe was done at my direction and has been reviewed and validated by me [name], MD.

## 2016-01-18 NOTE — ED Nurses Note (Signed)
Tresa EndoKelly from crisis made aware to see patient

## 2016-01-18 NOTE — ED Nurses Note (Signed)
Patient was offered a box of lunch and sprite

## 2016-01-18 NOTE — ED Nurses Note (Signed)
[facility] crew

## 2016-01-18 NOTE — ED Nurses Note (Signed)
Report given to [facility] transport crew.

## 2016-01-18 NOTE — Behavioral Health (Signed)
[facility] by Dr Conley RollsBez.

## 2016-01-18 NOTE — Behavioral Health (Signed)
[facility], she states they do have a possible bed. Requesting labs and H&P.    She is requesting to speak with the pt.  Will have the pt call.

## 2016-01-18 NOTE — ED Provider Notes (Signed)
Jose Bills, MD  [facility]  Emergency Department Visit Note    Date:  01/18/2016  Primary care provider:  None Given  Means of arrival:  private car  History obtained from: patient  History limited by: none    Chief Complaint:  Outpatient Substance Abuse Treatment Referral    HISTORY OF PRESENT ILLNESS     Jose Daniels, date of birth July 17, 1950, is a 65 y.o. male who presents to the Emergency Department for outpatient substance abuse treatment referral. Patient reports he has been drinking for four days (01/14/16), last drank at 0200 today (01/18/16). He indicates usually drinking a 12 pack of beer a day. He admits he has also been shooting cocaine, last done a week ago. He admits he also smokes cocaine, last done yesterday (01/17/16). He notes  He is going through withdrawal, is having confusion, which he admits occurs when he is not using cocaine or alcohol. He affirms having nausea. Denies current suicidal or homicidal ideations. Patient does not report any alleviating or aggravating factors.     He has a history of hepatitis C.     REVIEW OF SYSTEMS     The pertinent positive and negative symptoms are as per HPI. All other systems reviewed and are negative.     PATIENT HISTORY     Past Medical History:  Past Medical History:   Diagnosis Date    Bipolar affect, depressed (HCC)     Chronic back pain     Glaucoma     Hep C w/ coma, chronic (HCC)     HTN (hypertension)     Schizoaffective disorder (HCC)        Past Surgical History:  Past Surgical History:   Procedure Laterality Date    Hx no surgical procedures         Family History:  No history of acute family illness given at this time.       Social History:  Social History   Substance Use Topics    Smoking status: Never Smoker    Smokeless tobacco: None    Alcohol use Yes      Comment: 12 pk day     History   Drug Use    Yes    Special: Cocaine, Marijuana     Comment: cocaine 3 days ago marijuana a while       Medications:  Outpatient  Prescriptions Marked as Taking for the 01/18/16 encounter [facility])   Medication Sig    amLODIPine (NORVASC) 10 mg Oral Tablet Take 1 Tab (10 mg total) by mouth Once a day       Allergies:  Allergies   Allergen Reactions    Penicillins        PHYSICAL EXAM     Vitals:  Filed Vitals:    01/18/16 1112   BP: (!) 151/99   Pulse: 74   Resp: 17   Temp: 36.5 C (97.7 F)   SpO2: 99%     Pulse ox  99% on None (Room Air) interpreted by me as: Normal    Constitutional: Appears well-developed and well-nourished. Does not appear to be in distress. Patient does not appear to be in extensive withdrawal.    HENT:   Head: Atraumatic.  Nose: No rhinorrhea.   Mouth/Throat: No posterior oropharyngeal erythema.   Eyes: EOM are normal. Pupils are equal, round, and reactive to light.   Neck: No JVD present. Carotid bruit is not present. Neck is supple  with full range of motion.  Cardiovascular: Normal rate and regular rhythm. No murmurs, rubs, or gallops.   Pulmonary/Chest: No accessory muscle usage. No respiratory distress. Lungs are clear to auscultation. No wheezes, rales, or rhonchi.  Abdominal: Bowel sounds are normal. There is no tenderness.   Lymphadenopathy:  No cervical adenopathy.   Extremities: Normal range of motion. No edema.  Neurological: CN intact.  No sensory or motor focal deficits.   Skin: No rash noted. No cyanosis.   Psychiatric: Normal mood and affect. Behavior is normal.  No SI or HI.     DIAGNOSTIC STUDIES     Labs:    Results for orders placed or performed during the hospital encounter of 01/18/16   COMPREHENSIVE METABOLIC PROFILE - [facility] ONLY   Result Value Ref Range    SODIUM 136 136 - 145 mmol/L    POTASSIUM 3.6 3.4 - 5.1 mmol/L    CHLORIDE 102 101 - 111 mmol/L    CO2 TOTAL 28 22 - 32 mmol/L    ANION GAP 6 3 - 11 mmol/L    BUN 9 6 - 20 mg/dL    CREATININE 1.61 0.96 - 1.24 mg/dL    BUN/CREA RATIO 9 6 - 22    ESTIMATED GFR >60 >60 mL/min/1.9m2    ALBUMIN 3.9 3.5 - 5.0 g/dL    CALCIUM 9.4 8.8 -  04.5 mg/dL    GLUCOSE 83 70 - 409 mg/dL    ALKALINE PHOSPHATASE 87 38 - 126 U/L    ALT (SGPT) 41 17 - 63 U/L    AST (SGOT) 44 (H) 15 - 41 U/L    BILIRUBIN TOTAL 0.5 0.3 - 1.2 mg/dL    PROTEIN TOTAL 7.5 6.4 - 8.3 g/dL    ALBUMIN/GLOBULIN RATIO 1.1 0.8 - 2.0   DRUG SCREEN,URINE,RAPID - [facility] ONLY   Result Value Ref Range    AMPHETAMINES URINE Negative Negative    BARBITURATES URINE Negative Negative    BENZODIAZEPINES URINE Negative Negative    PCP URINE Negative Negative    METHADONE URINE Negative Negative    OPIATES URINE Negative Negative    CANNABINOIDS URINE Negative Negative    COCAINE METABOLITES URINE Positive (A) Negative    OXYCODONE URINE Negative Negative    TRICYCLIC ANTIDEPRESSANTS URINE Negative Negative   ETHANOL, SERUM   Result Value Ref Range    ETHANOL <10 <10 mg/dL    ETHANOL Not Detected    THYROID STIMULATING HORMONE WITH FREE T4 REFLEX   Result Value Ref Range    TSH 1.061 0.340 - 5.330 uIU/mL   URINALYSIS WITH MICROSCOPIC REFLEX IF INDICATED [facility] ONLY   Result Value Ref Range    COLOR Yellow Light Yellow, Straw, Yellow    APPEARANCE Clear Clear    PH 5.0 <8.0    LEUKOCYTES Negative Negative WBCs/uL    NITRITE Negative Negative    PROTEIN Negative Negative mg/dL    GLUCOSE Negative Negative mg/dL    KETONES Negative Negative mg/dL    UROBILINOGEN 2.0  <=8.1 mg/dL    BILIRUBIN Negative Negative mg/dL    BLOOD Moderate (A) Negative mg/dL    SPECIFIC GRAVITY 1.914 <1.022   CBC WITH DIFF   Result Value Ref Range    WBC 4.8 4.0 - 11.0 x103/uL    RBC 4.76 4.40 - 5.80 x106/uL    HGB 15.3 13.5 - 18.0 g/dL    HCT 78.2 95.6 - 21.3 %    MCV 96.5 82.0 - 97.0 fL  MCH 32.1 27.5 - 33.2 pg    MCHC 33.3 32.0 - 36.0 g/dL    RDW 40.9 81.1 - 91.4 %    PLATELETS 187 150 - 450 x103/uL    MPV 8.4 7.4 - 10.5 fL    NEUTROPHIL % 45 43 - 76 %    LYMPHOCYTE % 36 15 - 43 %    MONOCYTE % 16 (H) 5 - 12 %    EOSINOPHIL % 3 0 - 5 %    BASOPHIL % 1 0 - 3 %    NEUTROPHIL # 2.10 1.50 - 6.50 x103/uL    LYMPHOCYTE # 1.70  1.00 - 4.80 x103/uL    MONOCYTE # 0.70 0.20 - 0.90 x103/uL    EOSINOPHIL # 0.10 0.00 - 0.50 x103/uL    BASOPHIL # 0.10 0.00 - 0.10 x103/uL   BLUE TOP TUBE   Result Value Ref Range    RAINBOW/EXTRA TUBE AUTO RESULT Yes    URINALYSIS, MICROSCOPIC   Result Value Ref Range    RBCS 30-50 (A) 0 - 2 /hpf    WBCS 0-2 0 - 2 /hpf    BACTERIA Slight (A) None /hpf    SQUAMOUS EPITHELIAL None 0 - 2 /hpf    MUCOUS Slight (A) None /hpf     Labs reviewed and interpreted by me.    ECG:   12 lead EKG interpreted by me shows normal sinus rhythm, rate of 77 bpm, normal axis, normal intervals, T-wave inversions from V2-V6, consistent with EKG from 01/11/16. No acute abnormalities.     ED PROGRESS NOTE / MEDICAL DECISION MAKING     Old records reviewed by me:  I have reviewed the nurse's notes. I have reviewed the patient's problem list.      Orders Placed This Encounter    COMPREHENSIVE METABOLIC PROFILE - [facility] ONLY    DRUG SCREEN,URINE,RAPID - [facility] ONLY    ETHANOL, SERUM    THYROID STIMULATING HORMONE WITH FREE T4 REFLEX    URINALYSIS WITH MICROSCOPIC REFLEX IF INDICATED [facility] ONLY    RAINBOW DRAW - [facility] ONLY    CBC WITH DIFF    URINALYSIS, MICROSCOPIC    ECG 12-Lead     Labs ordered prior to evaluation.      1131: Initial evaluation is complete at this time. Will medically clear patient and have the crisis worker evaluate. I discussed with the patient that I would medically clear and have the crisis worker come to bedside to further evaluate. Patient is agreeable with the treatment plan at this time. I have screened the patient for tobacco use and the patient is a tobacco non-user.     Patient is medically cleared for psychiatric disposition.     1517: Per Harvin Hazel Government social research officer), patient has been admitted to [facility] in [place], [place]. He has been accepted by Dr. Conley Rolls (Psychiatry).     [facility] in [facility] condition.      Pre-Disposition Vitals:  Filed Vitals:    01/18/16 1330 01/18/16 1515 01/18/16  1600 01/18/16 1730   BP: (!) 151/101 (!) 154/102 (!) 147/106 (!) 151/102   Pulse:       Resp:       Temp:       SpO2: 98% 100% 98% 99%     CLINICAL IMPRESSION     Alcohol dependence     DISPOSITION/PLAN     Transferred to Another Facility        Condition at Disposition: Stable  SCRIBE ATTESTATION STATEMENT  I Corey HaroldEliel Vega, SCRIBE scribed for Jose BillsWilliamson, Montague Corella H, MD on 01/18/2016 at 11:17 AM.     Documentation assistance provided for Jose BillsWilliamson, Porchea Charrier H, MD  by Corey HaroldEliel Vega, SCRIBE. Information recorded by the scribe was done at my direction and has been reviewed and validated by me Jose BillsWilliamson, Ardella Chhim H, MD.

## 2016-01-18 NOTE — ED Nurses Note (Signed)
Report was given to Apolinar JunesBrandon, [facility]

## 2016-01-18 NOTE — Behavioral Health (Signed)
Pt seeking detox from alcohol. He states that he has been drinking for 40 years. He also reports occasional cocaine use.     He states he is here for detox and is aware that he has to go straight to detox from the hospital.     He denies SI, HI and psychosis.     Will begin to find placement for the pt.

## 2016-01-18 NOTE — ED Nurses Note (Signed)
Patient is resting comfortably, warm blankets offered per request

## 2016-01-18 NOTE — Behavioral Health (Signed)
Jose Catchingsraig, ED US setting up transportation at this time.    Number for report is [phone]

## 2016-01-18 NOTE — ED Nurses Note (Addendum)
Patient is alert and awake, denies pain, suicidal and homicidal ideation, connected to BP and SPO2 monitor, call light is within reach

## 2016-01-22 LAB — ECG 12-LEAD
Atrial Rate: 77 {beats}/min
Atrial Rate: 77 {beats}/min
Calculated P Axis: 64 degrees
Calculated R Axis: 25 degrees
Calculated T Axis: 150 degrees
PR Interval: 176 ms
QRS Duration: 82 ms
QT Interval: 402 ms
QTC Calculation: 454 ms
Ventricular rate: 77 {beats}/min

## 2017-03-13 IMAGING — DX DG CHEST 2V
2 series · 2 of 2 positions shown · non-contrast
Comparison: 02/01/2015

CLINICAL DATA: Pt here with c/o suicidal thoughts , pt states that
he has not had his meds since [REDACTED]. Cough and SOB x 2 days. Hx of
HTN.

EXAM:
CHEST  2 VIEW

[chest pa]
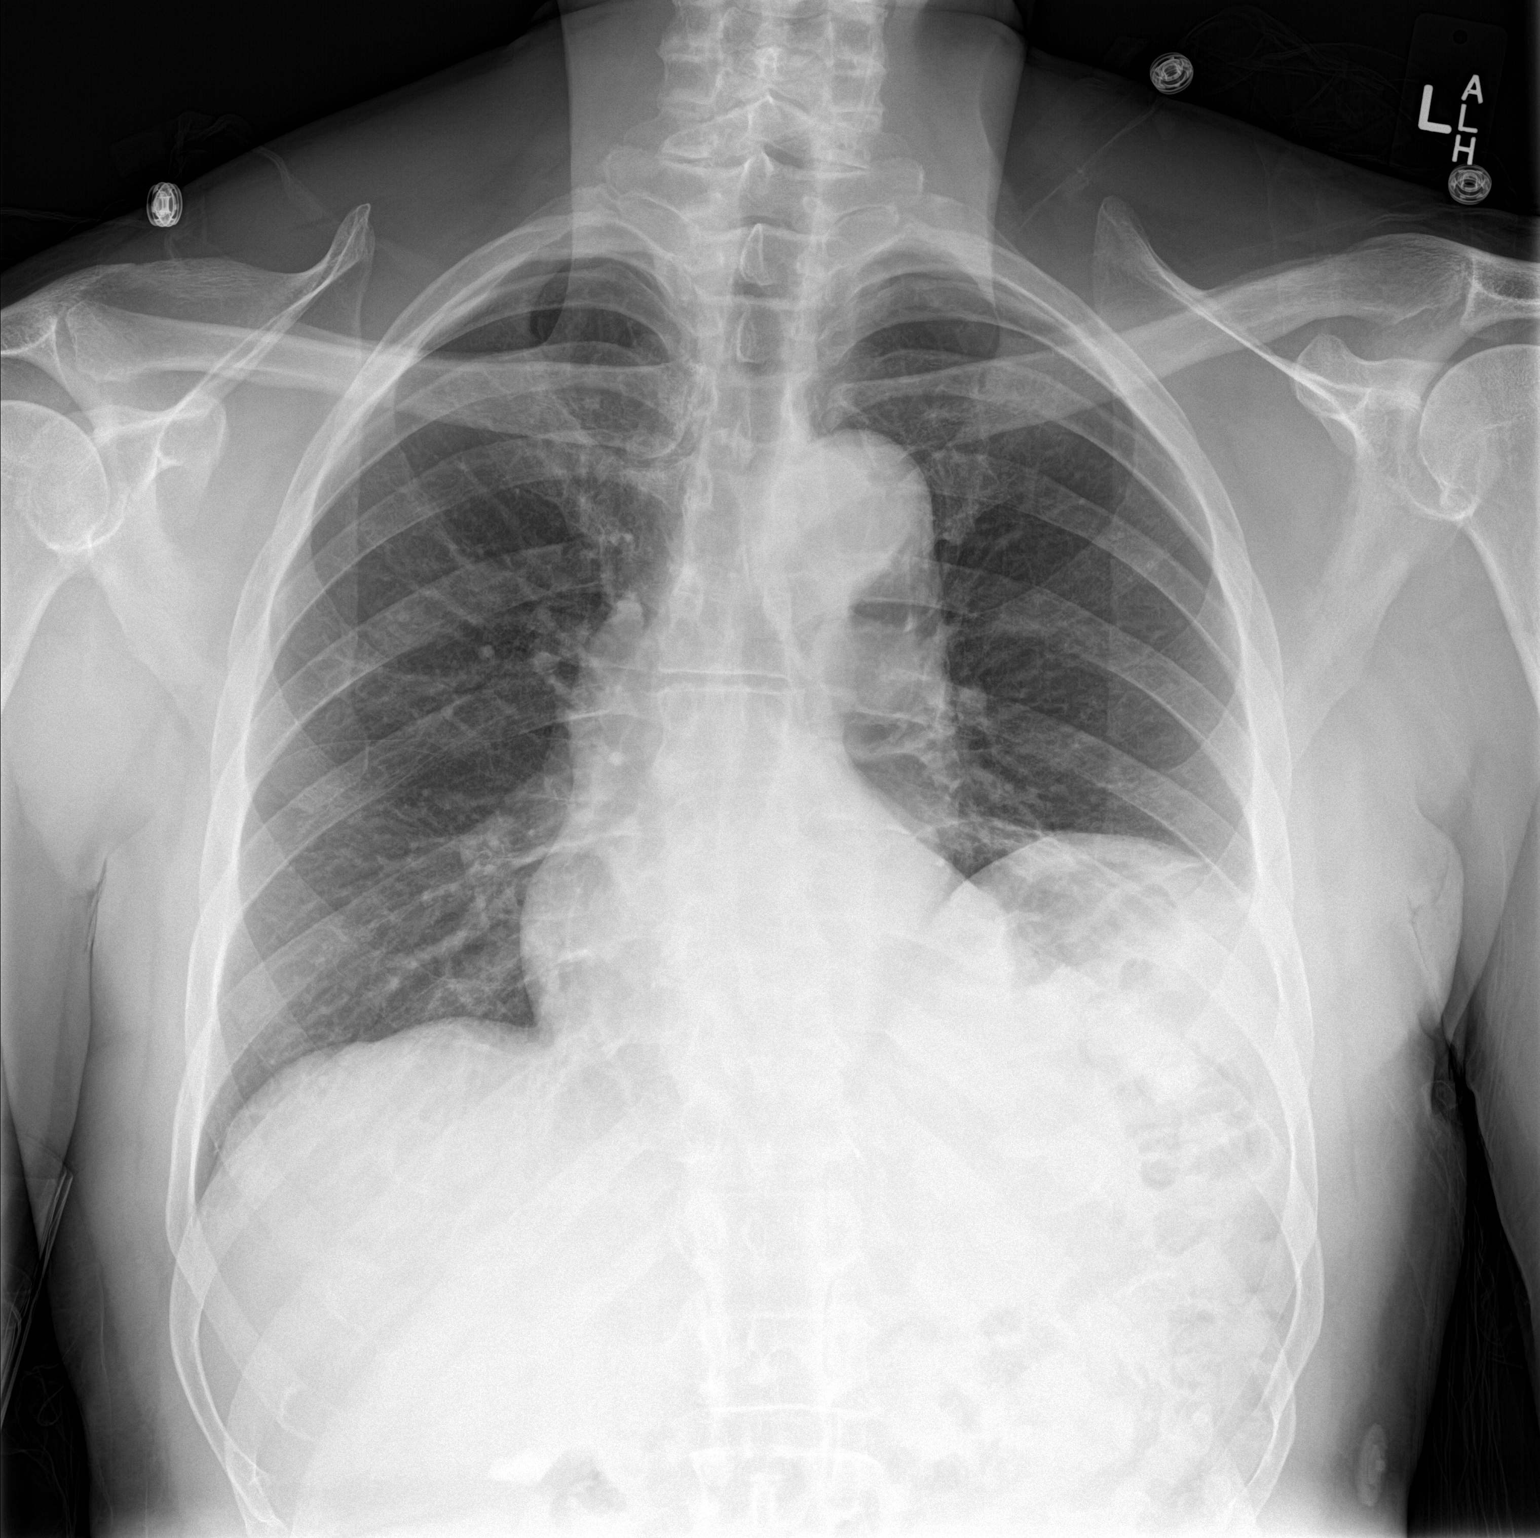

[chest lat]
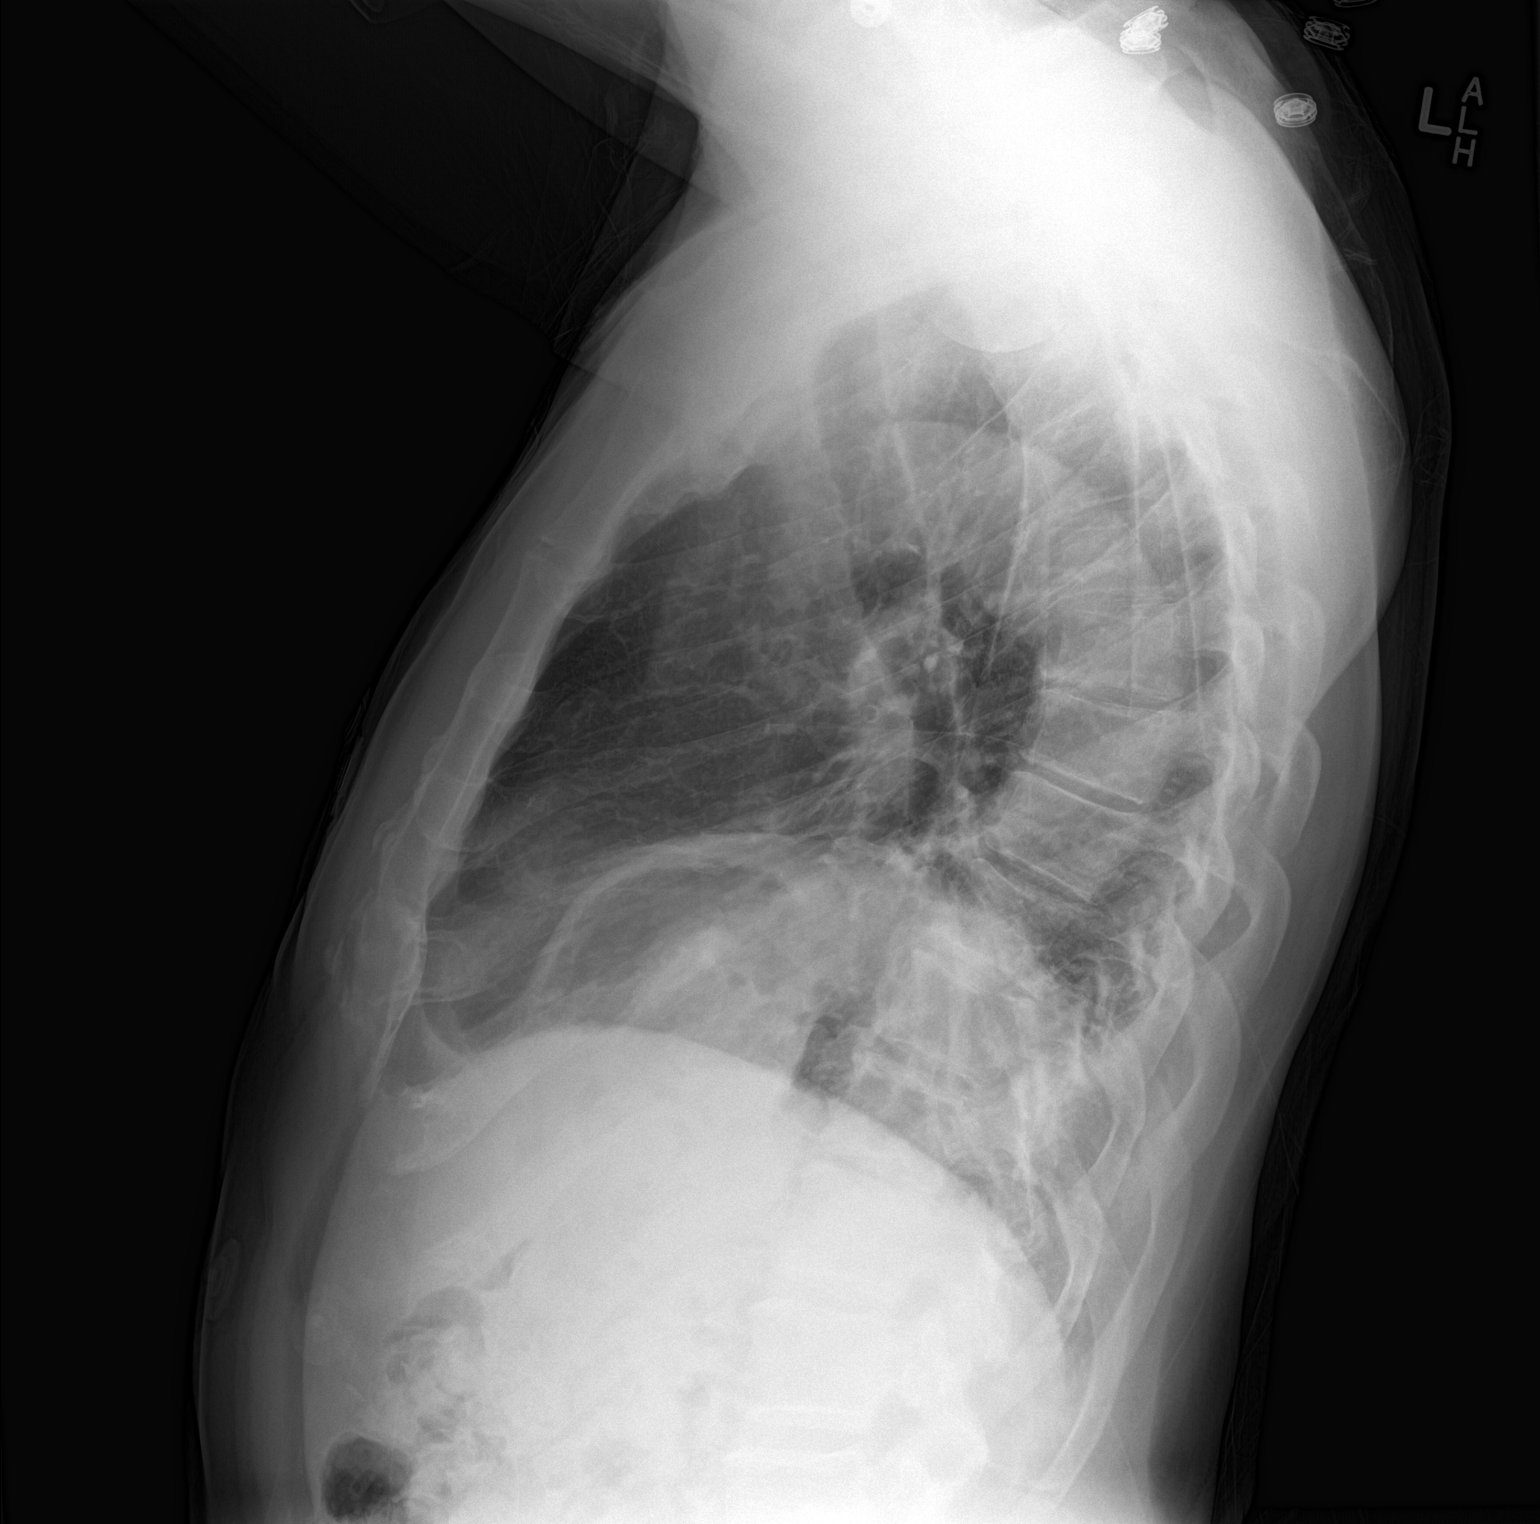

[2 of 2 positions shown; findings below may reference images not displayed]

FINDINGS: Cardiac silhouette normal in size and configuration. The aorta is
uncoiled. No mediastinal or hilar masses or evidence of adenopathy.

Clear lungs.  Elevated left hemidiaphragm is stable.

No pleural effusion or pneumothorax.

Bony thorax is intact.
IMPRESSION: No active cardiopulmonary disease.

## 2017-04-28 IMAGING — DX DG CHEST 2V
2 series · 2 of 2 positions shown · non-contrast
Comparison: Chest radiograph 04/22/2015.

CLINICAL DATA: Patient mid sternal chest pain.  Lightheadedness.

EXAM:
CHEST  2 VIEW

[chest pa]
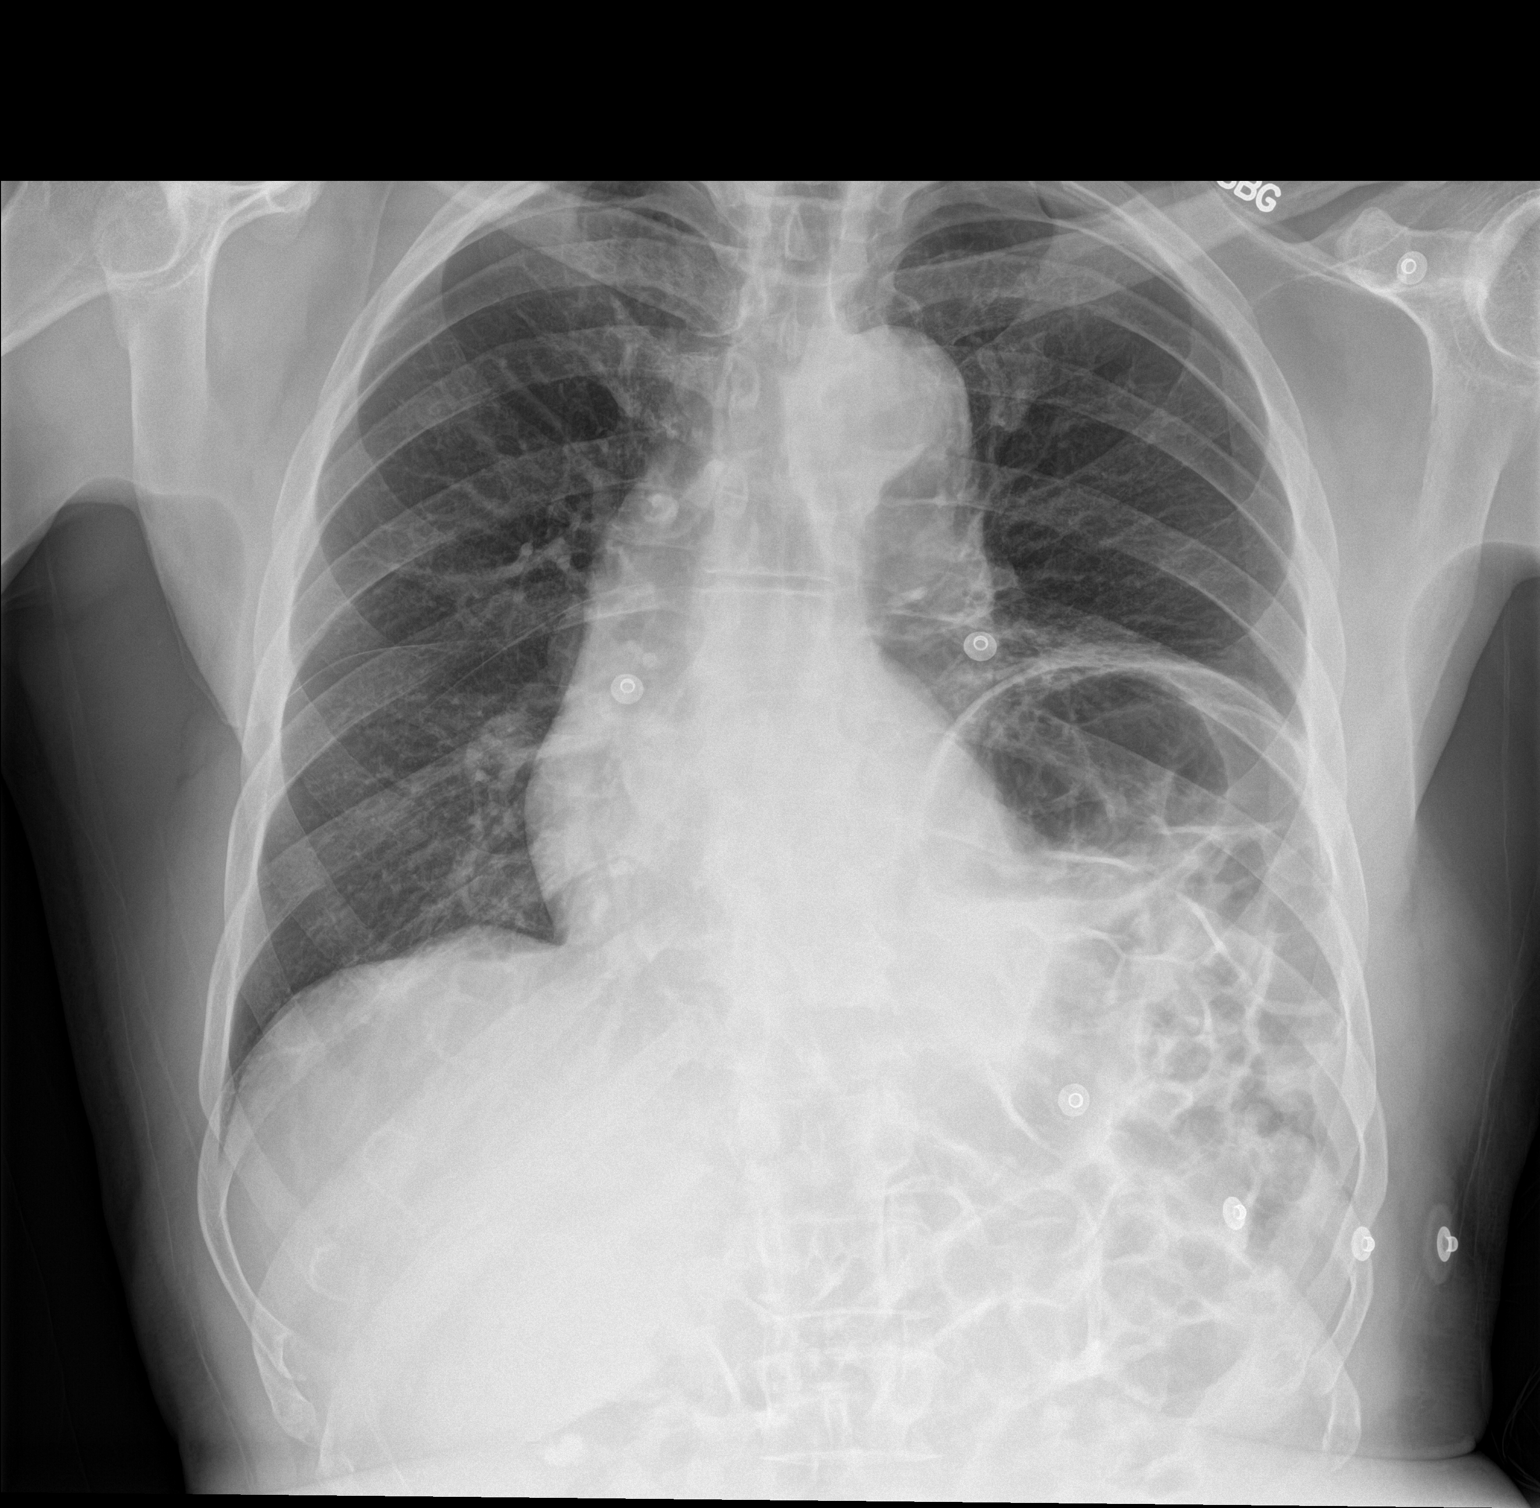

[chest lat]
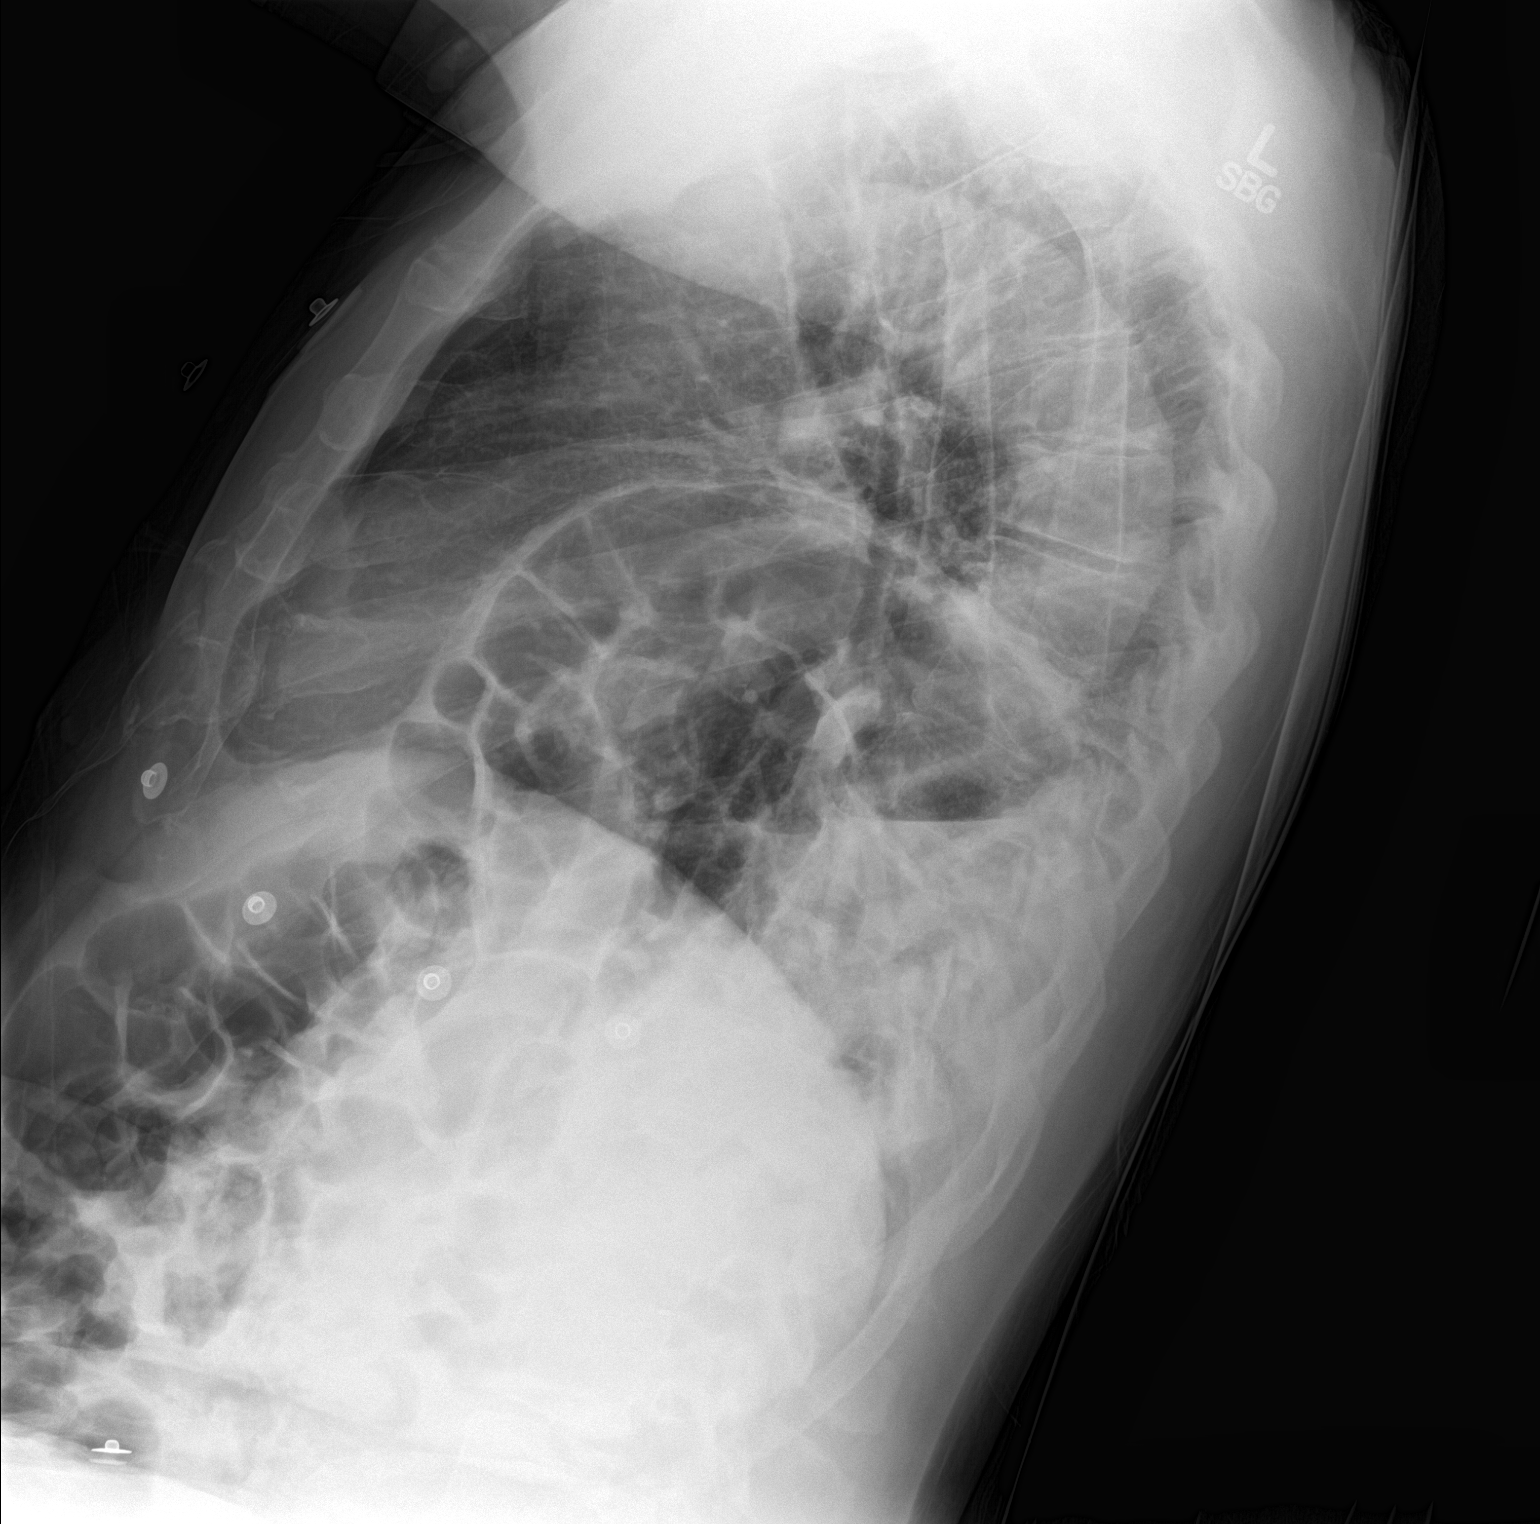

[2 of 2 positions shown; findings below may reference images not displayed]

FINDINGS: Stable cardiac and mediastinal contours. Persistent elevation left
hemidiaphragm with left basilar heterogeneous opacities. The right
lung is clear. No pleural effusion or pneumothorax. Mid thoracic
spine degenerative changes.
IMPRESSION: Persistent elevation left hemidiaphragm with left basilar opacities
favored to represent atelectasis.

No acute cardiopulmonary process.

## 2023-09-01 ENCOUNTER — Other Ambulatory Visit (HOSPITAL_COMMUNITY): Payer: Self-pay
